# Patient Record
Sex: Male | Born: 1945 | Race: White | Hispanic: No | Marital: Married | Smoking: Former smoker
Health system: Southern US, Community
[De-identification: ages and names within clinical notes are randomized; demographics above are authoritative.]

## PROBLEM LIST (undated history)

## (undated) DIAGNOSIS — E119 Type 2 diabetes mellitus without complications: Secondary | ICD-10-CM

## (undated) DIAGNOSIS — Z8619 Personal history of other infectious and parasitic diseases: Secondary | ICD-10-CM

## (undated) DIAGNOSIS — I10 Essential (primary) hypertension: Secondary | ICD-10-CM

## (undated) DIAGNOSIS — E669 Obesity, unspecified: Secondary | ICD-10-CM

## (undated) DIAGNOSIS — E785 Hyperlipidemia, unspecified: Secondary | ICD-10-CM

## (undated) DIAGNOSIS — C801 Malignant (primary) neoplasm, unspecified: Secondary | ICD-10-CM

## (undated) DIAGNOSIS — R29898 Other symptoms and signs involving the musculoskeletal system: Secondary | ICD-10-CM

## (undated) HISTORY — DX: Personal history of other infectious and parasitic diseases: Z86.19

## (undated) HISTORY — DX: Malignant (primary) neoplasm, unspecified: C80.1

## (undated) HISTORY — DX: Type 2 diabetes mellitus without complications: E11.9

## (undated) HISTORY — DX: Hyperlipidemia, unspecified: E78.5

## (undated) HISTORY — DX: Essential (primary) hypertension: I10

## (undated) HISTORY — DX: Obesity, unspecified: E66.9

## (undated) HISTORY — DX: Other symptoms and signs involving the musculoskeletal system: R29.898

---

## 2002-04-01 DIAGNOSIS — Z86010 Personal history of colonic polyps: Secondary | ICD-10-CM | POA: Insufficient documentation

## 2002-04-01 LAB — HM COLONOSCOPY

## 2002-10-02 ENCOUNTER — Encounter: Payer: Self-pay | Admitting: Neurosurgery

## 2002-10-02 ENCOUNTER — Encounter: Payer: Self-pay | Admitting: Diagnostic Radiology

## 2002-10-02 ENCOUNTER — Encounter: Admission: RE | Admit: 2002-10-02 | Discharge: 2002-10-02 | Payer: Self-pay | Admitting: Neurosurgery

## 2002-10-16 ENCOUNTER — Encounter: Admission: RE | Admit: 2002-10-16 | Discharge: 2002-10-16 | Payer: Self-pay | Admitting: Neurosurgery

## 2002-10-16 ENCOUNTER — Encounter: Payer: Self-pay | Admitting: Neurosurgery

## 2004-03-06 ENCOUNTER — Emergency Department: Payer: Self-pay | Admitting: Emergency Medicine

## 2004-08-06 HISTORY — PX: OTHER SURGICAL HISTORY: SHX169

## 2004-12-28 ENCOUNTER — Emergency Department: Payer: Self-pay | Admitting: Emergency Medicine

## 2005-02-06 ENCOUNTER — Ambulatory Visit: Payer: Self-pay | Admitting: General Surgery

## 2005-06-24 ENCOUNTER — Inpatient Hospital Stay: Payer: Self-pay

## 2005-06-24 ENCOUNTER — Other Ambulatory Visit: Payer: Self-pay

## 2010-07-06 ENCOUNTER — Ambulatory Visit: Payer: Self-pay | Admitting: Gastroenterology

## 2010-07-06 LAB — HM COLONOSCOPY

## 2010-07-07 LAB — PATHOLOGY REPORT

## 2010-07-11 ENCOUNTER — Ambulatory Visit: Payer: Self-pay | Admitting: Gastroenterology

## 2010-07-11 DIAGNOSIS — K760 Fatty (change of) liver, not elsewhere classified: Secondary | ICD-10-CM | POA: Insufficient documentation

## 2010-07-11 HISTORY — PX: OTHER SURGICAL HISTORY: SHX169

## 2011-05-21 ENCOUNTER — Ambulatory Visit: Payer: Self-pay | Admitting: Family Medicine

## 2011-06-04 ENCOUNTER — Ambulatory Visit: Payer: Self-pay | Admitting: Family Medicine

## 2011-07-04 ENCOUNTER — Ambulatory Visit: Payer: Self-pay | Admitting: Family Medicine

## 2011-07-12 ENCOUNTER — Emergency Department: Payer: Self-pay | Admitting: Emergency Medicine

## 2011-07-16 ENCOUNTER — Ambulatory Visit: Payer: Self-pay | Admitting: Family Medicine

## 2011-08-16 ENCOUNTER — Ambulatory Visit: Payer: Self-pay | Admitting: Pain Medicine

## 2011-09-18 ENCOUNTER — Encounter: Payer: Self-pay | Admitting: Specialist

## 2011-10-04 ENCOUNTER — Encounter: Payer: Self-pay | Admitting: Specialist

## 2012-08-06 DIAGNOSIS — E669 Obesity, unspecified: Secondary | ICD-10-CM | POA: Diagnosis not present

## 2012-08-06 DIAGNOSIS — Z794 Long term (current) use of insulin: Secondary | ICD-10-CM | POA: Diagnosis not present

## 2012-11-10 DIAGNOSIS — Z794 Long term (current) use of insulin: Secondary | ICD-10-CM | POA: Diagnosis not present

## 2013-03-09 DIAGNOSIS — IMO0001 Reserved for inherently not codable concepts without codable children: Secondary | ICD-10-CM | POA: Diagnosis not present

## 2013-03-16 DIAGNOSIS — I10 Essential (primary) hypertension: Secondary | ICD-10-CM | POA: Diagnosis not present

## 2013-03-16 DIAGNOSIS — IMO0001 Reserved for inherently not codable concepts without codable children: Secondary | ICD-10-CM | POA: Diagnosis not present

## 2013-03-16 DIAGNOSIS — Z794 Long term (current) use of insulin: Secondary | ICD-10-CM | POA: Diagnosis not present

## 2013-03-30 DIAGNOSIS — E1149 Type 2 diabetes mellitus with other diabetic neurological complication: Secondary | ICD-10-CM | POA: Diagnosis not present

## 2013-03-30 DIAGNOSIS — M79609 Pain in unspecified limb: Secondary | ICD-10-CM | POA: Diagnosis not present

## 2013-03-30 DIAGNOSIS — E1142 Type 2 diabetes mellitus with diabetic polyneuropathy: Secondary | ICD-10-CM | POA: Diagnosis not present

## 2013-03-30 DIAGNOSIS — B351 Tinea unguium: Secondary | ICD-10-CM | POA: Diagnosis not present

## 2013-06-08 DIAGNOSIS — H2510 Age-related nuclear cataract, unspecified eye: Secondary | ICD-10-CM | POA: Diagnosis not present

## 2013-06-17 DIAGNOSIS — IMO0001 Reserved for inherently not codable concepts without codable children: Secondary | ICD-10-CM | POA: Diagnosis not present

## 2013-06-24 DIAGNOSIS — Z794 Long term (current) use of insulin: Secondary | ICD-10-CM | POA: Diagnosis not present

## 2013-06-24 DIAGNOSIS — E119 Type 2 diabetes mellitus without complications: Secondary | ICD-10-CM | POA: Diagnosis not present

## 2013-06-24 DIAGNOSIS — E875 Hyperkalemia: Secondary | ICD-10-CM | POA: Diagnosis not present

## 2013-09-22 DIAGNOSIS — E119 Type 2 diabetes mellitus without complications: Secondary | ICD-10-CM | POA: Diagnosis not present

## 2013-09-29 DIAGNOSIS — IMO0001 Reserved for inherently not codable concepts without codable children: Secondary | ICD-10-CM | POA: Diagnosis not present

## 2013-10-01 DIAGNOSIS — IMO0001 Reserved for inherently not codable concepts without codable children: Secondary | ICD-10-CM | POA: Diagnosis not present

## 2013-10-05 DIAGNOSIS — E1149 Type 2 diabetes mellitus with other diabetic neurological complication: Secondary | ICD-10-CM | POA: Diagnosis not present

## 2013-10-05 DIAGNOSIS — B351 Tinea unguium: Secondary | ICD-10-CM | POA: Diagnosis not present

## 2013-10-05 DIAGNOSIS — E1142 Type 2 diabetes mellitus with diabetic polyneuropathy: Secondary | ICD-10-CM | POA: Diagnosis not present

## 2013-11-18 DIAGNOSIS — E119 Type 2 diabetes mellitus without complications: Secondary | ICD-10-CM | POA: Diagnosis not present

## 2013-11-18 DIAGNOSIS — I10 Essential (primary) hypertension: Secondary | ICD-10-CM | POA: Diagnosis not present

## 2013-11-18 DIAGNOSIS — Z23 Encounter for immunization: Secondary | ICD-10-CM | POA: Diagnosis not present

## 2013-11-18 DIAGNOSIS — L219 Seborrheic dermatitis, unspecified: Secondary | ICD-10-CM | POA: Diagnosis not present

## 2013-11-30 DIAGNOSIS — R9431 Abnormal electrocardiogram [ECG] [EKG]: Secondary | ICD-10-CM | POA: Diagnosis not present

## 2013-11-30 DIAGNOSIS — Z125 Encounter for screening for malignant neoplasm of prostate: Secondary | ICD-10-CM | POA: Diagnosis not present

## 2013-11-30 DIAGNOSIS — E785 Hyperlipidemia, unspecified: Secondary | ICD-10-CM | POA: Diagnosis not present

## 2013-11-30 DIAGNOSIS — I10 Essential (primary) hypertension: Secondary | ICD-10-CM | POA: Diagnosis not present

## 2013-11-30 DIAGNOSIS — R609 Edema, unspecified: Secondary | ICD-10-CM | POA: Diagnosis not present

## 2013-11-30 LAB — PSA: PSA: 0.1

## 2013-11-30 LAB — LIPID PANEL
CHOLESTEROL: 147 mg/dL (ref 0–200)
HDL: 25 mg/dL — AB (ref 35–70)
LDL Cholesterol: 50 mg/dL
TRIGLYCERIDES: 359 mg/dL — AB (ref 40–160)

## 2013-12-14 DIAGNOSIS — I10 Essential (primary) hypertension: Secondary | ICD-10-CM | POA: Diagnosis not present

## 2013-12-14 DIAGNOSIS — R0602 Shortness of breath: Secondary | ICD-10-CM | POA: Diagnosis not present

## 2013-12-14 DIAGNOSIS — R9431 Abnormal electrocardiogram [ECG] [EKG]: Secondary | ICD-10-CM | POA: Diagnosis not present

## 2013-12-14 DIAGNOSIS — I119 Hypertensive heart disease without heart failure: Secondary | ICD-10-CM | POA: Diagnosis not present

## 2014-01-01 DIAGNOSIS — E1165 Type 2 diabetes mellitus with hyperglycemia: Secondary | ICD-10-CM | POA: Diagnosis not present

## 2014-01-01 DIAGNOSIS — Z794 Long term (current) use of insulin: Secondary | ICD-10-CM | POA: Diagnosis not present

## 2014-01-11 DIAGNOSIS — I119 Hypertensive heart disease without heart failure: Secondary | ICD-10-CM | POA: Diagnosis not present

## 2014-01-11 DIAGNOSIS — I10 Essential (primary) hypertension: Secondary | ICD-10-CM | POA: Diagnosis not present

## 2014-01-11 DIAGNOSIS — E782 Mixed hyperlipidemia: Secondary | ICD-10-CM | POA: Diagnosis not present

## 2014-01-11 DIAGNOSIS — I5032 Chronic diastolic (congestive) heart failure: Secondary | ICD-10-CM | POA: Diagnosis not present

## 2014-02-12 DIAGNOSIS — L739 Follicular disorder, unspecified: Secondary | ICD-10-CM | POA: Diagnosis not present

## 2014-03-31 DIAGNOSIS — E1165 Type 2 diabetes mellitus with hyperglycemia: Secondary | ICD-10-CM | POA: Diagnosis not present

## 2014-04-05 DIAGNOSIS — E1142 Type 2 diabetes mellitus with diabetic polyneuropathy: Secondary | ICD-10-CM | POA: Diagnosis not present

## 2014-04-05 DIAGNOSIS — B351 Tinea unguium: Secondary | ICD-10-CM | POA: Diagnosis not present

## 2014-05-18 DIAGNOSIS — I10 Essential (primary) hypertension: Secondary | ICD-10-CM | POA: Diagnosis not present

## 2014-05-18 DIAGNOSIS — E119 Type 2 diabetes mellitus without complications: Secondary | ICD-10-CM | POA: Diagnosis not present

## 2014-06-08 DIAGNOSIS — E11329 Type 2 diabetes mellitus with mild nonproliferative diabetic retinopathy without macular edema: Secondary | ICD-10-CM | POA: Diagnosis not present

## 2014-06-08 DIAGNOSIS — H2513 Age-related nuclear cataract, bilateral: Secondary | ICD-10-CM | POA: Diagnosis not present

## 2014-06-30 DIAGNOSIS — Z794 Long term (current) use of insulin: Secondary | ICD-10-CM | POA: Diagnosis not present

## 2014-06-30 DIAGNOSIS — E119 Type 2 diabetes mellitus without complications: Secondary | ICD-10-CM | POA: Diagnosis not present

## 2014-06-30 DIAGNOSIS — I10 Essential (primary) hypertension: Secondary | ICD-10-CM | POA: Diagnosis not present

## 2014-07-09 DIAGNOSIS — B029 Zoster without complications: Secondary | ICD-10-CM | POA: Diagnosis not present

## 2014-10-04 DIAGNOSIS — B351 Tinea unguium: Secondary | ICD-10-CM | POA: Diagnosis not present

## 2014-10-04 DIAGNOSIS — E1142 Type 2 diabetes mellitus with diabetic polyneuropathy: Secondary | ICD-10-CM | POA: Diagnosis not present

## 2014-10-26 DIAGNOSIS — E119 Type 2 diabetes mellitus without complications: Secondary | ICD-10-CM | POA: Diagnosis not present

## 2014-10-28 ENCOUNTER — Other Ambulatory Visit: Payer: Self-pay | Admitting: Family Medicine

## 2014-11-02 DIAGNOSIS — I10 Essential (primary) hypertension: Secondary | ICD-10-CM | POA: Diagnosis not present

## 2014-11-02 DIAGNOSIS — Z794 Long term (current) use of insulin: Secondary | ICD-10-CM | POA: Diagnosis not present

## 2014-11-02 DIAGNOSIS — E1129 Type 2 diabetes mellitus with other diabetic kidney complication: Secondary | ICD-10-CM | POA: Diagnosis not present

## 2014-11-02 DIAGNOSIS — R809 Proteinuria, unspecified: Secondary | ICD-10-CM | POA: Diagnosis not present

## 2014-11-15 DIAGNOSIS — E118 Type 2 diabetes mellitus with unspecified complications: Secondary | ICD-10-CM | POA: Insufficient documentation

## 2014-11-15 DIAGNOSIS — H9190 Unspecified hearing loss, unspecified ear: Secondary | ICD-10-CM | POA: Insufficient documentation

## 2014-11-15 DIAGNOSIS — D128 Benign neoplasm of rectum: Secondary | ICD-10-CM | POA: Insufficient documentation

## 2014-11-15 DIAGNOSIS — N529 Male erectile dysfunction, unspecified: Secondary | ICD-10-CM | POA: Insufficient documentation

## 2014-11-15 DIAGNOSIS — L219 Seborrheic dermatitis, unspecified: Secondary | ICD-10-CM | POA: Insufficient documentation

## 2014-11-15 DIAGNOSIS — I5030 Unspecified diastolic (congestive) heart failure: Secondary | ICD-10-CM | POA: Insufficient documentation

## 2014-11-15 DIAGNOSIS — E119 Type 2 diabetes mellitus without complications: Secondary | ICD-10-CM | POA: Insufficient documentation

## 2014-11-15 DIAGNOSIS — R609 Edema, unspecified: Secondary | ICD-10-CM | POA: Insufficient documentation

## 2014-11-15 DIAGNOSIS — I5032 Chronic diastolic (congestive) heart failure: Secondary | ICD-10-CM | POA: Insufficient documentation

## 2014-11-15 DIAGNOSIS — M48061 Spinal stenosis, lumbar region without neurogenic claudication: Secondary | ICD-10-CM | POA: Insufficient documentation

## 2014-11-15 DIAGNOSIS — R9431 Abnormal electrocardiogram [ECG] [EKG]: Secondary | ICD-10-CM | POA: Insufficient documentation

## 2014-11-15 DIAGNOSIS — I10 Essential (primary) hypertension: Secondary | ICD-10-CM | POA: Insufficient documentation

## 2014-11-15 DIAGNOSIS — N200 Calculus of kidney: Secondary | ICD-10-CM | POA: Insufficient documentation

## 2014-11-15 DIAGNOSIS — Z87828 Personal history of other (healed) physical injury and trauma: Secondary | ICD-10-CM | POA: Insufficient documentation

## 2014-11-15 DIAGNOSIS — E785 Hyperlipidemia, unspecified: Secondary | ICD-10-CM | POA: Insufficient documentation

## 2014-11-16 ENCOUNTER — Encounter: Payer: Self-pay | Admitting: Family Medicine

## 2014-11-16 ENCOUNTER — Ambulatory Visit (INDEPENDENT_AMBULATORY_CARE_PROVIDER_SITE_OTHER): Payer: Medicare Other | Admitting: Family Medicine

## 2014-11-16 VITALS — BP 128/68 | HR 76 | Temp 98.3°F | Resp 16 | Ht 76.0 in | Wt 362.0 lb

## 2014-11-16 DIAGNOSIS — E119 Type 2 diabetes mellitus without complications: Secondary | ICD-10-CM | POA: Diagnosis not present

## 2014-11-16 DIAGNOSIS — L219 Seborrheic dermatitis, unspecified: Secondary | ICD-10-CM | POA: Diagnosis not present

## 2014-11-16 DIAGNOSIS — Z23 Encounter for immunization: Secondary | ICD-10-CM | POA: Diagnosis not present

## 2014-11-16 DIAGNOSIS — E785 Hyperlipidemia, unspecified: Secondary | ICD-10-CM

## 2014-11-16 DIAGNOSIS — I10 Essential (primary) hypertension: Secondary | ICD-10-CM | POA: Diagnosis not present

## 2014-11-16 MED ORDER — KETOCONAZOLE 2 % EX CREA: 1.0000 "application " | TOPICAL_CREAM | Freq: Every day | CUTANEOUS | Status: DC

## 2014-11-16 NOTE — Progress Notes (Signed)
Patient: Scott Simpson Male    DOB: 11-19-1945   69 y.o.   MRN: [id number] Visit Date: 11/16/2014  Today's Provider: Lelon Huh, MD   Chief Complaint  Patient presents with  . Hypertension    follow up  . Hyperlipidemia    follow up  . Congestive Heart Failure    follow up   Subjective:    HPI      Hypertension, follow-up:  BP Readings from Last 3 Encounters:  11/16/14 128/68  05/18/14 120/70    He was last seen for hypertension 6 months ago.  BP at that visit was  120/70. Management since that visit includes  none. He reports good compliance with treatment. He is not having side effects.  He is not exercising. He is not adherent to low salt diet.   Outside blood pressures are not being checked. He is experiencing none.  Patient denies chest pain, chest pressure/discomfort, claudication, dyspnea, exertional chest pressure/discomfort, fatigue, irregular heart beat, lower extremity edema, near-syncope, orthopnea, palpitations, paroxysmal nocturnal dyspnea, syncope and tachypnea.   Cardiovascular risk factors include diabetes mellitus, dyslipidemia, hypertension, male gender and sedentary lifestyle.  Use of agents associated with hypertension: none.     Weight trend: stable Wt Readings from Last 3 Encounters:  05/18/14 358 lb (162.388 kg)    Current diet: in general, an "unhealthy" diet    Lipid/Cholesterol, Follow-up:   Last seen for this6 months ago.  Management changes since that visit include  none. . Last Lipid Panel:    Component Value Date/Time   CHOL 147 11/30/2013   TRIG 359* 11/30/2013   HDL 25* 11/30/2013   LDLCALC 50 11/30/2013    Risk factors for vascular disease include diabetes mellitus, hypercholesterolemia and hypertension  He reports good compliance with treatment. He is not having side effects.  Current symptoms include none and have been stable. Weight trend: stable Prior visit with dietician: no Current diet: in  general, an "unhealthy" diet Current exercise: none  Wt Readings from Last 3 Encounters:  05/18/14 358 lb (162.388 kg)    -------------------------------------------------------------------   Follow up Diastolic Dysfunction with heart failure: Last office visit was 6 months ago and no changes were made. Patient Cardiologist is Dr. Nehemiah Massed and the last follow up visit with him was 01/11/2014. Patient reports he does not have an upcoming appointment scheduled.   ------------------------------------------------------------------------  Seborrhea Previously prescribed triamcinolone, which worked on rash on side of neck, but has not been very effective for areas on forehead.    No Known Allergies Previous Medications   AMLODIPINE (NORVASC) 5 MG TABLET    Take 1 tablet by mouth daily.   CANAGLIFLOZIN (INVOKANA) 300 MG TABS TABLET    Take 1 tablet by mouth daily.   FOLIC ACID (FOLVITE) 1 MG TABLET    Take 1 tablet by mouth daily.   FUROSEMIDE (LASIX) 20 MG TABLET    Take 1 tablet by mouth daily as needed. For swelling   HYDRALAZINE (APRESOLINE) 100 MG TABLET    Take 100 mg by mouth 2 (two) times daily.   INSULIN REGULAR HUMAN CONCENTRATED (HUMULIN R) 500 UNIT/ML INJECTION    Inject 27 Units into the skin every morning. And lunch time. Inject 29 units at night   LISINOPRIL (PRINIVIL,ZESTRIL) 20 MG TABLET    Take 1 tablet by mouth daily.   LOVASTATIN (MEVACOR) 40 MG TABLET    take 1 tablet by mouth once daily   METFORMIN (GLUCOPHAGE) 1000 MG  TABLET    Take 1,000 mg by mouth 2 (two) times daily with a meal.   METOPROLOL SUCCINATE (TOPROL-XL) 100 MG 24 HR TABLET    Take 1 tablet by mouth daily.   MUPIROCIN OINTMENT (BACTROBAN) 2 %    Apply 1 application topically 3 (three) times daily.    Review of Systems  Constitutional: Negative for fever, chills and appetite change.  Respiratory: Negative for chest tightness, shortness of breath and wheezing.   Cardiovascular: Negative for chest pain  and palpitations.  Gastrointestinal: Negative for nausea, vomiting and abdominal pain.    Social History  Substance Use Topics  . Smoking status: Former Smoker -- 2.00 packs/day for 35 years    Types: Cigarettes    Quit date: 03/05/2004  . Smokeless tobacco: Not on file  . Alcohol Use: No   Objective:   BP 128/68 mmHg  Pulse 76  Temp(Src) 98.3 F (36.8 C) (Oral)  Resp 16  Ht '6\' 4"'$  (1.93 m)  Wt 362 lb (164.202 kg)  BMI 44.08 kg/m2  SpO2 93%  Physical Exam General Appearance:    Alert, cooperative, no distress, obese  Eyes:    PERRL, conjunctiva/corneas clear, EOM's intact       Lungs:     Clear to auscultation bilaterally, respirations unlabored  Heart:    Regular rate and rhythm  Neurologic:   Awake, alert, oriented x 3. No apparent focal neurological           defect.            Assessment & Plan:     1. Diabetes mellitus without complication well controlled The current medical regimen is effective;  continue present plan and medications.   2. HLD (hyperlipidemia) He is tolerating lovastatin well with no adverse effects.   - Lipid panel  3. Essential hypertension well controlled Continue current medications.    4. Seborrhea Forehead. Tried triamcinolone which was previously written for rash on neck, but was not effective.  - ketoconazole (NIZORAL) 2 % cream; Apply 1 application topically daily.  Dispense: 30 g; Refill: 3  5. Need for pneumococcal vaccination - Pneumovax-23  6. Need for influenza vaccination  - Flu vaccine HIGH DOSE PF       Lelon Huh, MD  [facility]

## 2014-11-17 LAB — LIPID PANEL
CHOLESTEROL TOTAL: 133 mg/dL (ref 100–199)
Chol/HDL Ratio: 4.9 ratio units (ref 0.0–5.0)
HDL: 27 mg/dL — ABNORMAL LOW (ref >39)
LDL CALC: 46 mg/dL (ref 0–99)
Triglycerides: 299 mg/dL — ABNORMAL HIGH (ref 0–149)
VLDL Cholesterol Cal: 60 mg/dL — ABNORMAL HIGH (ref 5–40)

## 2014-12-21 ENCOUNTER — Other Ambulatory Visit: Payer: Self-pay | Admitting: *Deleted

## 2014-12-21 MED ORDER — METOPROLOL SUCCINATE ER 100 MG PO TB24: 100.0000 mg | ORAL_TABLET | Freq: Every day | ORAL | Status: DC

## 2015-01-22 ENCOUNTER — Other Ambulatory Visit: Payer: Self-pay | Admitting: Family Medicine

## 2015-02-21 ENCOUNTER — Other Ambulatory Visit: Payer: Self-pay | Admitting: Family Medicine

## 2015-02-23 ENCOUNTER — Other Ambulatory Visit: Payer: Self-pay | Admitting: Family Medicine

## 2015-04-04 DIAGNOSIS — Z794 Long term (current) use of insulin: Secondary | ICD-10-CM | POA: Diagnosis not present

## 2015-04-04 DIAGNOSIS — E1142 Type 2 diabetes mellitus with diabetic polyneuropathy: Secondary | ICD-10-CM | POA: Diagnosis not present

## 2015-04-04 DIAGNOSIS — B351 Tinea unguium: Secondary | ICD-10-CM | POA: Diagnosis not present

## 2015-04-26 ENCOUNTER — Other Ambulatory Visit: Payer: Self-pay | Admitting: *Deleted

## 2015-04-26 MED ORDER — AMLODIPINE BESYLATE 5 MG PO TABS: 5.0000 mg | ORAL_TABLET | Freq: Every day | ORAL | Status: DC

## 2015-04-27 DIAGNOSIS — E1129 Type 2 diabetes mellitus with other diabetic kidney complication: Secondary | ICD-10-CM | POA: Diagnosis not present

## 2015-04-27 DIAGNOSIS — R809 Proteinuria, unspecified: Secondary | ICD-10-CM | POA: Diagnosis not present

## 2015-04-27 DIAGNOSIS — Z794 Long term (current) use of insulin: Secondary | ICD-10-CM | POA: Diagnosis not present

## 2015-04-27 LAB — HEMOGLOBIN A1C: Hemoglobin A1C: 6.5

## 2015-05-04 DIAGNOSIS — R809 Proteinuria, unspecified: Secondary | ICD-10-CM | POA: Diagnosis not present

## 2015-05-04 DIAGNOSIS — I10 Essential (primary) hypertension: Secondary | ICD-10-CM | POA: Diagnosis not present

## 2015-05-04 DIAGNOSIS — Z794 Long term (current) use of insulin: Secondary | ICD-10-CM | POA: Diagnosis not present

## 2015-05-04 DIAGNOSIS — E1129 Type 2 diabetes mellitus with other diabetic kidney complication: Secondary | ICD-10-CM | POA: Diagnosis not present

## 2015-05-17 ENCOUNTER — Ambulatory Visit (INDEPENDENT_AMBULATORY_CARE_PROVIDER_SITE_OTHER): Payer: Medicare Other | Admitting: Family Medicine

## 2015-05-17 ENCOUNTER — Encounter: Payer: Self-pay | Admitting: Family Medicine

## 2015-05-17 VITALS — BP 130/64 | HR 82 | Temp 98.1°F | Resp 20 | Ht 76.0 in | Wt 360.0 lb

## 2015-05-17 DIAGNOSIS — I10 Essential (primary) hypertension: Secondary | ICD-10-CM

## 2015-05-17 DIAGNOSIS — E119 Type 2 diabetes mellitus without complications: Secondary | ICD-10-CM | POA: Diagnosis not present

## 2015-05-17 DIAGNOSIS — E669 Obesity, unspecified: Secondary | ICD-10-CM | POA: Diagnosis not present

## 2015-05-17 NOTE — Patient Instructions (Signed)
   Recommend taking 81mg enteric coated aspirin to reduce risk of vascular events such as heart attacks and strokes.    

## 2015-05-17 NOTE — Progress Notes (Signed)
Patient: Scott Simpson Male    DOB: 11/21/1945   70 y.o.   MRN: [id number] Visit Date: 05/17/2015  Today's Provider: Lelon Huh, MD   Chief Complaint  Patient presents with  . Follow-up  . Diabetes    followed by Endocrinology  . Hypertension  . Hyperlipidemia   Subjective:    HPI  ------------------------------------------------------------------------   Hypertension, follow-up:  BP Readings from Last 3 Encounters:  05/17/15 142/64  11/16/14 128/68  05/18/14 120/70    He was last seen for hypertension 6 months ago.  BP at that visit was  128/68. Management since that visit includes no changes.He reports good compliance with treatment. He is not having side effects.  He is not exercising. He is not adherent to low salt diet.   Outside blood pressures are not being checked. He is experiencing none.  Patient denies chest pain, chest pressure/discomfort, claudication, dyspnea, exertional chest pressure/discomfort, fatigue, irregular heart beat, lower extremity edema, near-syncope, orthopnea, palpitations, paroxysmal nocturnal dyspnea, syncope and tachypnea.   Cardiovascular risk factors include advanced age (older than 4 for men, 7 for women), diabetes mellitus, dyslipidemia, hypertension, male gender and sedentary lifestyle.  Use of agents associated with hypertension: none.   ------------------------------------------------------------------------    Lipid/Cholesterol, Follow-up:   Last seen for this 6 months ago.  Management since that visit includes no changes.  Last Lipid Panel:    Component Value Date/Time   CHOL 133 11/16/2014 1549   CHOL 147 11/30/2013   TRIG 299* 11/16/2014 1549   HDL 27* 11/16/2014 1549   HDL 25* 11/30/2013   CHOLHDL 4.9 11/16/2014 1549   LDLCALC 46 11/16/2014 1549   LDLCALC 50 11/30/2013    He reports good compliance with treatment. He is not having side effects.   Wt Readings from Last 3 Encounters:  05/17/15  360 lb (163.295 kg)  11/16/14 362 lb (164.202 kg)  05/18/14 358 lb (162.388 kg)    ------------------------------------------------------------------------ Diabetes follow up  Continue regular follow up with Dr. Gabriel Carina with last A1c in February=6.4%      No Known Allergies Previous Medications   AMLODIPINE (NORVASC) 5 MG TABLET    Take 1 tablet (5 mg total) by mouth daily.   CANAGLIFLOZIN (INVOKANA) 300 MG TABS TABLET    Take 1 tablet by mouth daily.   FOLIC ACID (FOLVITE) 1 MG TABLET    take 1 tablet by mouth once daily   FUROSEMIDE (LASIX) 20 MG TABLET    take 1 tablet by mouth once daily if needed edema   HYDRALAZINE (APRESOLINE) 100 MG TABLET    Take 100 mg by mouth 2 (two) times daily.   INSULIN REGULAR HUMAN CONCENTRATED (HUMULIN R) 500 UNIT/ML INJECTION    Inject 25 Units into the skin every morning. Inject 27 units at noom. Inject 29 units at night   KETOCONAZOLE (NIZORAL) 2 % CREAM    Apply 1 application topically daily.   LISINOPRIL (PRINIVIL,ZESTRIL) 20 MG TABLET    take 1 tablet by mouth once daily   LOVASTATIN (MEVACOR) 40 MG TABLET    take 1 tablet by mouth once daily   METFORMIN (GLUCOPHAGE) 1000 MG TABLET    Take 1,000 mg by mouth 2 (two) times daily with a meal.   METOPROLOL SUCCINATE (TOPROL-XL) 100 MG 24 HR TABLET    Take 1 tablet (100 mg total) by mouth daily.   MUPIROCIN OINTMENT (BACTROBAN) 2 %    Apply 1 application topically 3 (three) times daily.  Review of Systems  Constitutional: Negative for fever, chills and appetite change.  Respiratory: Negative for chest tightness, shortness of breath and wheezing.   Cardiovascular: Negative for chest pain and palpitations.  Gastrointestinal: Negative for nausea, vomiting and abdominal pain.    Social History  Substance Use Topics  . Smoking status: Former Smoker -- 2.00 packs/day for 35 years    Types: Cigarettes    Quit date: 03/05/2004  . Smokeless tobacco: Not on file  . Alcohol Use: No   Objective:    BP 142/64 mmHg  Pulse 82  Temp(Src) 98.1 F (36.7 C) (Oral)  Resp 20  Ht '6\' 4"'$  (1.93 m)  Wt 360 lb (163.295 kg)  BMI 43.84 kg/m2  Physical Exam  General Appearance:    Alert, cooperative, no distress, morbidly obese  Eyes:    PERRL, conjunctiva/corneas clear, EOM's intact       Lungs:     Clear to auscultation bilaterally, respirations unlabored  Heart:    Regular rate and rhythm  Neurologic:   Awake, alert, oriented x 3. No apparent focal neurological           defect.           Assessment & Plan:     1. Obesity Strongly encourage regular exercise.   2. Essential hypertension Well controlled.  Continue current medications.    3. Diabetes mellitus without complication (HCC) Doing well on current regiment managed by Dr. Gabriel Carina.  Lelon Huh, MD  [facility]

## 2015-06-14 DIAGNOSIS — H2513 Age-related nuclear cataract, bilateral: Secondary | ICD-10-CM | POA: Diagnosis not present

## 2015-06-14 LAB — HM DIABETES EYE EXAM

## 2015-09-12 DIAGNOSIS — Z794 Long term (current) use of insulin: Secondary | ICD-10-CM | POA: Diagnosis not present

## 2015-09-12 DIAGNOSIS — E1129 Type 2 diabetes mellitus with other diabetic kidney complication: Secondary | ICD-10-CM | POA: Diagnosis not present

## 2015-09-12 DIAGNOSIS — R809 Proteinuria, unspecified: Secondary | ICD-10-CM | POA: Diagnosis not present

## 2015-09-19 DIAGNOSIS — I10 Essential (primary) hypertension: Secondary | ICD-10-CM | POA: Diagnosis not present

## 2015-09-19 DIAGNOSIS — Z794 Long term (current) use of insulin: Secondary | ICD-10-CM | POA: Diagnosis not present

## 2015-09-19 DIAGNOSIS — R809 Proteinuria, unspecified: Secondary | ICD-10-CM | POA: Diagnosis not present

## 2015-09-19 DIAGNOSIS — E1129 Type 2 diabetes mellitus with other diabetic kidney complication: Secondary | ICD-10-CM | POA: Diagnosis not present

## 2015-10-03 DIAGNOSIS — B351 Tinea unguium: Secondary | ICD-10-CM | POA: Diagnosis not present

## 2015-10-03 DIAGNOSIS — E1142 Type 2 diabetes mellitus with diabetic polyneuropathy: Secondary | ICD-10-CM | POA: Diagnosis not present

## 2015-10-03 DIAGNOSIS — Z794 Long term (current) use of insulin: Secondary | ICD-10-CM | POA: Diagnosis not present

## 2015-10-26 ENCOUNTER — Other Ambulatory Visit: Payer: Self-pay | Admitting: Family Medicine

## 2015-11-01 ENCOUNTER — Other Ambulatory Visit: Payer: Self-pay

## 2015-11-21 ENCOUNTER — Ambulatory Visit (INDEPENDENT_AMBULATORY_CARE_PROVIDER_SITE_OTHER): Payer: Medicare Other | Admitting: Family Medicine

## 2015-11-21 ENCOUNTER — Encounter: Payer: Self-pay | Admitting: Family Medicine

## 2015-11-21 VITALS — BP 118/62 | HR 78 | Temp 99.4°F | Resp 18 | Wt 376.0 lb

## 2015-11-21 DIAGNOSIS — Z23 Encounter for immunization: Secondary | ICD-10-CM | POA: Diagnosis not present

## 2015-11-21 DIAGNOSIS — Z125 Encounter for screening for malignant neoplasm of prostate: Secondary | ICD-10-CM | POA: Diagnosis not present

## 2015-11-21 DIAGNOSIS — I10 Essential (primary) hypertension: Secondary | ICD-10-CM

## 2015-11-21 DIAGNOSIS — E119 Type 2 diabetes mellitus without complications: Secondary | ICD-10-CM

## 2015-11-21 DIAGNOSIS — E785 Hyperlipidemia, unspecified: Secondary | ICD-10-CM | POA: Diagnosis not present

## 2015-11-21 DIAGNOSIS — Z7409 Other reduced mobility: Secondary | ICD-10-CM

## 2015-11-21 DIAGNOSIS — Z86010 Personal history of colonic polyps: Secondary | ICD-10-CM | POA: Diagnosis not present

## 2015-11-21 NOTE — Patient Instructions (Addendum)
   Please call [facility] Department of Gastroenterology [phone] to schedule follow up colonoscopy   Walk outside for at least 10 minutes 2-3 times a day while the weather is nice

## 2015-11-21 NOTE — Progress Notes (Signed)
Patient: Scott Simpson Male    DOB: 1945-06-17   70 y.o.   MRN: [id number] Visit Date: 11/21/2015  Today's Provider: Lelon Huh, MD   Chief Complaint  Patient presents with  . Hypertension    follow up  . Hyperlipidemia    follow up  . Obesity    follow up  . Diabetes    follow up   Subjective:    HPI  Hypertension, follow-up:  BP Readings from Last 3 Encounters:  05/17/15 130/64  11/16/14 128/68  05/18/14 120/70    He was last seen for hypertension 6 months ago.  BP at that visit was 142/64. Management since that visit includes no changes. He reports good compliance with treatment. He is not having side effects.  He is not exercising. He is not adherent to low salt diet.   Outside blood pressures are checked occasionally. He is experiencing none.  Patient denies chest pain, chest pressure/discomfort, claudication, dyspnea, exertional chest pressure/discomfort, fatigue, irregular heart beat, lower extremity edema, near-syncope, orthopnea, palpitations, paroxysmal nocturnal dyspnea, syncope and tachypnea.   Cardiovascular risk factors include advanced age (older than 45 for men, 43 for women), diabetes mellitus, hypertension, male gender, obesity (BMI >= 30 kg/m2) and sedentary lifestyle.  Use of agents associated with hypertension: NSAIDS.     Weight trend: increasing steadily Wt Readings from Last 3 Encounters:  05/17/15 (!) 360 lb (163.3 kg)  11/16/14 (!) 362 lb (164.2 kg)  05/18/14 (!) 358 lb (162.4 kg)    Current diet: in general, an "unhealthy" diet  ------------------------------------------------------------------------  Lipid/Cholesterol, Follow-up:   Last seen for this1 months ago.  Management changes since that visit include no changes. . Last Lipid Panel:    Component Value Date/Time   CHOL 133 11/16/2014 1549   TRIG 299 (H) 11/16/2014 1549   HDL 27 (L) 11/16/2014 1549   CHOLHDL 4.9 11/16/2014 1549   LDLCALC 46 11/16/2014 1549      Risk factors for vascular disease include diabetes mellitus and hypertension  He reports good compliance with treatment. He is not having side effects.  Current symptoms include none and have been stable. Weight trend: increasing steadily Prior visit with dietician: no Current diet: in general, an "unhealthy" diet Current exercise: none  Wt Readings from Last 3 Encounters:  05/17/15 (!) 360 lb (163.3 kg)  11/16/14 (!) 362 lb (164.2 kg)  05/18/14 (!) 358 lb (162.4 kg)    ------------------------------------------------------------------- Follow up Obesity:  Patient was last seen 6 months ago for this problem. Dur that visit, patient was encouraged to start regular exercise. Patient comes in today stating he does not exercise at all.   Diabetes:  Last office visit was 6 months ago. No changes were made. Diabetes is managed by Dr. Gabriel Carina.  Patient was last seen Dr. Dr. Gabriel Carina 09/19/2015 per patient report and no changes were made. Last A1c by Dr. Gabriel Carina was 6.7     No Known Allergies   Current Outpatient Prescriptions:  .  amLODipine (NORVASC) 5 MG tablet, take 1 tablet by mouth once daily, Disp: 30 tablet, Rfl: 6 .  aspirin 81 MG tablet, Take 81 mg by mouth daily., Disp: , Rfl:  .  canagliflozin (INVOKANA) 300 MG TABS tablet, Take 1 tablet by mouth daily., Disp: , Rfl:  .  folic acid (FOLVITE) 1 MG tablet, take 1 tablet by mouth once daily, Disp: 90 tablet, Rfl: 4 .  furosemide (LASIX) 20 MG tablet, take 1 tablet by  mouth once daily if needed edema, Disp: 90 tablet, Rfl: 4 .  hydrALAZINE (APRESOLINE) 100 MG tablet, Take 100 mg by mouth 2 (two) times daily., Disp: , Rfl:  .  insulin regular human CONCENTRATED (HUMULIN R) 500 UNIT/ML injection, Inject 25 Units into the skin every morning. Inject 27 units at noom. Inject 29 units at night, Disp: , Rfl:  .  lisinopril (PRINIVIL,ZESTRIL) 20 MG tablet, take 1 tablet by mouth once daily, Disp: 90 tablet, Rfl: 4 .  lovastatin (MEVACOR)  40 MG tablet, take 1 tablet by mouth once daily, Disp: 90 tablet, Rfl: 3 .  metFORMIN (GLUCOPHAGE) 1000 MG tablet, Take 1,000 mg by mouth 2 (two) times daily with a meal., Disp: , Rfl:  .  metoprolol succinate (TOPROL-XL) 100 MG 24 hr tablet, Take 1 tablet (100 mg total) by mouth daily., Disp: 90 tablet, Rfl: 4  Review of Systems  Constitutional: Negative for appetite change, chills and fever.  Respiratory: Negative for chest tightness, shortness of breath and wheezing.   Cardiovascular: Negative for chest pain and palpitations.  Gastrointestinal: Negative for abdominal pain, nausea and vomiting.    Social History  Substance Use Topics  . Smoking status: Former Smoker    Packs/day: 2.00    Years: 35.00    Types: Cigarettes    Quit date: 03/05/2004  . Smokeless tobacco: Never Used  . Alcohol use No   Objective:   BP 118/62 (BP Location: Right Arm, Patient Position: Sitting, Cuff Size: Large)   Pulse 78   Temp 99.4 F (37.4 C) (Oral)   Resp 18   Wt (!) 376 lb (170.6 kg)   BMI 45.77 kg/m   Physical Exam  General Appearance:    Alert, cooperative, no distress, obese  Eyes:    PERRL, conjunctiva/corneas clear, EOM's intact       Lungs:     Clear to auscultation bilaterally, respirations unlabored  Heart:    Regular rate and rhythm  Neurologic:   Awake, alert, oriented x 3. No apparent focal neurological           defect.           Assessment & Plan:     1. Essential hypertension Well controlled.  Continue current medications.   - EKG 12-Lead  2. Diabetes mellitus without complication ([facility]) Doing well, continue routine follow up Dr. Nilda Simmer - EKG 12-Lead  3. HLD (hyperlipidemia) He is tolerating lovastatin well with no adverse effects.   - Lipid panel - Hepatic function panel  4. Need for influenza vaccination  - Flu vaccine HIGH DOSE PF  5. Prostate cancer screening  - PSA  6. History of colon polyps They recently received letter from GI stating it is time to  set up follow up colonoscopy and patient was encouraged to have this scheduled this year  14. Obesity  Encourage to to take advantage of mild weather and start taking 2-3 short walks every day.        Lelon Huh, MD  [facility]

## 2015-11-22 ENCOUNTER — Telehealth: Payer: Self-pay | Admitting: *Deleted

## 2015-11-22 LAB — LIPID PANEL
CHOL/HDL RATIO: 4.6 ratio (ref 0.0–5.0)
CHOLESTEROL TOTAL: 134 mg/dL (ref 100–199)
HDL: 29 mg/dL — AB (ref >39)
LDL CALC: 50 mg/dL (ref 0–99)
TRIGLYCERIDES: 277 mg/dL — AB (ref 0–149)
VLDL CHOLESTEROL CAL: 55 mg/dL — AB (ref 5–40)

## 2015-11-22 LAB — PSA: PROSTATE SPECIFIC AG, SERUM: 0.1 ng/mL (ref 0.0–4.0)

## 2015-11-22 LAB — HEPATIC FUNCTION PANEL
ALT: 21 IU/L (ref 0–44)
AST: 15 IU/L (ref 0–40)
Albumin: 4.1 g/dL (ref 3.6–4.8)
Alkaline Phosphatase: 59 IU/L (ref 39–117)
Bilirubin Total: 0.2 mg/dL (ref 0.0–1.2)
Bilirubin, Direct: 0.07 mg/dL (ref 0.00–0.40)
TOTAL PROTEIN: 6.9 g/dL (ref 6.0–8.5)

## 2015-11-22 MED ORDER — AMLODIPINE BESYLATE 2.5 MG PO TABS: 2.5000 mg | ORAL_TABLET | Freq: Every day | ORAL | 5 refills | Status: DC

## 2015-11-22 NOTE — Telephone Encounter (Signed)
-----   Message from Birdie Sons, MD sent at 11/22/2015  7:39 AM EDT ----- Labs are good. PSA is normal. Continue current medications.  Recommend reduce dose of amlodipine to 2.'5mg'$  daily, #30, rf x 5 since his BP was low, and follow up in 4 months for BP check. If BP is still doing well at follow up we may be able to get him off of this BP pill.

## 2015-11-22 NOTE — Telephone Encounter (Signed)
Patient's wife Vaughan Basta was notified of results. Expressed understanding. Rx sent to pharmacy.

## 2016-01-13 DIAGNOSIS — R809 Proteinuria, unspecified: Secondary | ICD-10-CM | POA: Diagnosis not present

## 2016-01-13 DIAGNOSIS — Z794 Long term (current) use of insulin: Secondary | ICD-10-CM | POA: Diagnosis not present

## 2016-01-13 DIAGNOSIS — E1129 Type 2 diabetes mellitus with other diabetic kidney complication: Secondary | ICD-10-CM | POA: Diagnosis not present

## 2016-01-16 ENCOUNTER — Other Ambulatory Visit: Payer: Self-pay | Admitting: *Deleted

## 2016-01-16 MED ORDER — METOPROLOL SUCCINATE ER 100 MG PO TB24: 100.0000 mg | ORAL_TABLET | Freq: Every day | ORAL | 4 refills | Status: DC

## 2016-01-20 DIAGNOSIS — E1129 Type 2 diabetes mellitus with other diabetic kidney complication: Secondary | ICD-10-CM | POA: Diagnosis not present

## 2016-01-20 DIAGNOSIS — H60543 Acute eczematoid otitis externa, bilateral: Secondary | ICD-10-CM | POA: Diagnosis not present

## 2016-01-20 DIAGNOSIS — Z794 Long term (current) use of insulin: Secondary | ICD-10-CM | POA: Diagnosis not present

## 2016-01-20 DIAGNOSIS — R809 Proteinuria, unspecified: Secondary | ICD-10-CM | POA: Diagnosis not present

## 2016-01-20 DIAGNOSIS — I10 Essential (primary) hypertension: Secondary | ICD-10-CM | POA: Diagnosis not present

## 2016-02-15 ENCOUNTER — Encounter: Payer: Self-pay | Admitting: Family Medicine

## 2016-02-15 ENCOUNTER — Encounter: Payer: Self-pay | Admitting: *Deleted

## 2016-03-15 ENCOUNTER — Other Ambulatory Visit: Payer: Self-pay | Admitting: Family Medicine

## 2016-03-21 ENCOUNTER — Ambulatory Visit: Payer: Medicare Other | Admitting: Family Medicine

## 2016-03-21 ENCOUNTER — Ambulatory Visit: Payer: Medicare Other

## 2016-04-16 LAB — HEMOGLOBIN A1C: Hemoglobin A1C: 6.3

## 2016-04-17 DIAGNOSIS — E1129 Type 2 diabetes mellitus with other diabetic kidney complication: Secondary | ICD-10-CM | POA: Diagnosis not present

## 2016-04-17 DIAGNOSIS — R809 Proteinuria, unspecified: Secondary | ICD-10-CM | POA: Diagnosis not present

## 2016-04-17 DIAGNOSIS — Z794 Long term (current) use of insulin: Secondary | ICD-10-CM | POA: Diagnosis not present

## 2016-04-17 LAB — MICROALBUMIN, URINE: MICROALB UR: 137

## 2016-04-18 ENCOUNTER — Ambulatory Visit (INDEPENDENT_AMBULATORY_CARE_PROVIDER_SITE_OTHER): Payer: Medicare Other

## 2016-04-18 ENCOUNTER — Encounter: Payer: Self-pay | Admitting: Family Medicine

## 2016-04-18 ENCOUNTER — Ambulatory Visit (INDEPENDENT_AMBULATORY_CARE_PROVIDER_SITE_OTHER): Payer: Medicare Other | Admitting: Family Medicine

## 2016-04-18 VITALS — BP 152/68 | HR 76 | Temp 97.9°F | Ht 76.0 in | Wt 377.8 lb

## 2016-04-18 VITALS — BP 142/66

## 2016-04-18 DIAGNOSIS — Z0000 Encounter for general adult medical examination without abnormal findings: Secondary | ICD-10-CM

## 2016-04-18 DIAGNOSIS — I10 Essential (primary) hypertension: Secondary | ICD-10-CM | POA: Diagnosis not present

## 2016-04-18 DIAGNOSIS — D128 Benign neoplasm of rectum: Secondary | ICD-10-CM

## 2016-04-18 NOTE — Progress Notes (Signed)
Patient: Scott Simpson Male    DOB: 02/03/1946   71 y.o.   MRN: [id number] Visit Date: 04/18/2016  Today's Provider: Lelon Huh, MD   Chief Complaint  Patient presents with  . Hypertension    follow up   . Hyperlipidemia    follow up    Subjective:    HPI  Hypertension, follow-up:  BP Readings from Last 3 Encounters:  04/18/16 (!) 152/68  11/21/15 118/62  05/17/15 130/64    He was last seen for hypertension 5 months ago.  BP at that visit was 118/62. Management since that visit includes decraesing Amlodipine to 2.'5mg'$  daily due to blood pressure running low. He reports good compliance with treatment. He is not having side effects.  He is not exercising. He is adherent to low salt diet.   Outside blood pressures are not being checked. He is experiencing none.  Patient denies chest pain, chest pressure/discomfort, claudication, dyspnea, exertional chest pressure/discomfort, fatigue, irregular heart beat, lower extremity edema, near-syncope, orthopnea, palpitations, paroxysmal nocturnal dyspnea, syncope and tachypnea.   Cardiovascular risk factors include advanced age (older than 69 for men, 22 for women), dyslipidemia, hypertension and male gender.  Use of agents associated with hypertension: NSAIDS.     Weight trend: stable Wt Readings from Last 3 Encounters:  04/18/16 (!) 377 lb 12.8 oz (171.4 kg)  11/21/15 (!) 376 lb (170.6 kg)  05/17/15 (!) 360 lb (163.3 kg)    Current diet: in general, an "unhealthy" diet  ------------------------------------------------------------------------  Lipid/Cholesterol, Follow-up:   Last seen for this1 years ago.  Management changes since that visit include none. . Last Lipid Panel:    Component Value Date/Time   CHOL 134 11/21/2015 1020   TRIG 277 (H) 11/21/2015 1020   HDL 29 (L) 11/21/2015 1020   CHOLHDL 4.6 11/21/2015 1020   LDLCALC 50 11/21/2015 1020    Risk factors for vascular disease include  hypercholesterolemia and hypertension  He reports good compliance with treatment. He is not having side effects.  Current symptoms include none and have been stable. Weight trend: stable Prior visit with dietician: no Current diet: in general, an "unhealthy" diet Current exercise: none  Wt Readings from Last 3 Encounters:  04/18/16 (!) 377 lb 12.8 oz (171.4 kg)  11/21/15 (!) 376 lb (170.6 kg)  05/17/15 (!) 360 lb (163.3 kg)    ------------------------------------------------------------------- Continues regular follow up with Dr. Nilda Simmer for diabetes every four months. Generally feels well with no complaints today.     No Known Allergies   Current Outpatient Prescriptions:  .  amLODipine (NORVASC) 2.5 MG tablet, Take 1 tablet (2.5 mg total) by mouth daily., Disp: 30 tablet, Rfl: 5 .  aspirin 81 MG tablet, Take 81 mg by mouth daily., Disp: , Rfl:  .  canagliflozin (INVOKANA) 300 MG TABS tablet, Take 1 tablet by mouth daily., Disp: , Rfl:  .  folic acid (FOLVITE) 1 MG tablet, take 1 tablet by mouth once daily, Disp: 90 tablet, Rfl: 4 .  furosemide (LASIX) 20 MG tablet, take 1 tablet by mouth once daily IF NEEDED FOR EDEMA, Disp: 90 tablet, Rfl: 4 .  hydrALAZINE (APRESOLINE) 100 MG tablet, Take 100 mg by mouth 2 (two) times daily., Disp: , Rfl:  .  insulin regular human CONCENTRATED (HUMULIN R) 500 UNIT/ML injection, Inject 25 Units into the skin every morning. Inject 27 units at noom. Inject 29 units at night, Disp: , Rfl:  .  lisinopril (PRINIVIL,ZESTRIL) 20 MG tablet,  take 1 tablet by mouth once daily, Disp: 90 tablet, Rfl: 4 .  lovastatin (MEVACOR) 40 MG tablet, take 1 tablet by mouth once daily, Disp: 90 tablet, Rfl: 3 .  metFORMIN (GLUCOPHAGE) 1000 MG tablet, Take 1,000 mg by mouth 2 (two) times daily with a meal., Disp: , Rfl:  .  metoprolol succinate (TOPROL-XL) 100 MG 24 hr tablet, Take 1 tablet (100 mg total) by mouth daily., Disp: 90 tablet, Rfl: 4  Review of Systems    Constitutional: Negative for appetite change, chills, fatigue and fever.  Respiratory: Negative for chest tightness, shortness of breath and wheezing.   Cardiovascular: Negative for chest pain and palpitations.  Gastrointestinal: Negative for abdominal pain, nausea and vomiting.    Social History  Substance Use Topics  . Smoking status: Former Smoker    Packs/day: 2.00    Years: 35.00    Types: Cigarettes    Quit date: 03/05/2004  . Smokeless tobacco: Never Used  . Alcohol use No   Objective:    Vital Signs - Last Recorded  Most recent update: 04/18/2016 3:06 PM by Fabio Neighbors, LPN  BP    086/76 (BP Location: Left Arm)     Pulse  76     Temp  97.9 F (36.6 C) (Oral)     Ht  '6\' 4"'$  (1.93 m)     Wt    377 lb 12.8 oz (171.4 kg)      BMI  45.99 kg/m     Vitals:   04/18/16 1546  BP: (!) 142/66      Physical Exam  General Appearance:    Alert, cooperative, no distress, morbidly obese  Eyes:    PERRL, conjunctiva/corneas clear, EOM's intact       Lungs:     Clear to auscultation bilaterally, respirations unlabored  Heart:    Regular rate and rhythm  Neurologic:   Awake, alert, oriented x 3. No apparent focal neurological           defect.           Assessment & Plan:     1. Essential hypertension BP up today. Advised to increase exercise and avoid salty foods. Will recheck in 4 months and may need another medications if not improved.   2. Adenoma of rectum Last colonoscopy was in May 2014 by Dr. Candace Cruise and was advised to follow up in 3 years.  - Ambulatory referral to Gastroenterology   Return in about 4 months (around 08/16/2016).        Lelon Huh, MD  [facility]

## 2016-04-18 NOTE — Patient Instructions (Signed)
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
## 2016-04-24 DIAGNOSIS — I10 Essential (primary) hypertension: Secondary | ICD-10-CM | POA: Diagnosis not present

## 2016-04-24 DIAGNOSIS — E1129 Type 2 diabetes mellitus with other diabetic kidney complication: Secondary | ICD-10-CM | POA: Diagnosis not present

## 2016-04-24 DIAGNOSIS — Z794 Long term (current) use of insulin: Secondary | ICD-10-CM | POA: Diagnosis not present

## 2016-04-24 DIAGNOSIS — R0683 Snoring: Secondary | ICD-10-CM | POA: Diagnosis not present

## 2016-04-24 DIAGNOSIS — R5382 Chronic fatigue, unspecified: Secondary | ICD-10-CM | POA: Diagnosis not present

## 2016-04-24 DIAGNOSIS — R809 Proteinuria, unspecified: Secondary | ICD-10-CM | POA: Diagnosis not present

## 2016-05-28 ENCOUNTER — Ambulatory Visit (INDEPENDENT_AMBULATORY_CARE_PROVIDER_SITE_OTHER): Payer: Medicare Other | Admitting: Family Medicine

## 2016-05-28 ENCOUNTER — Encounter: Payer: Self-pay | Admitting: Family Medicine

## 2016-05-28 VITALS — BP 150/80 | HR 78 | Temp 99.0°F | Resp 20 | Wt 372.0 lb

## 2016-05-28 DIAGNOSIS — B351 Tinea unguium: Secondary | ICD-10-CM | POA: Diagnosis not present

## 2016-05-28 DIAGNOSIS — I10 Essential (primary) hypertension: Secondary | ICD-10-CM

## 2016-05-28 DIAGNOSIS — E1142 Type 2 diabetes mellitus with diabetic polyneuropathy: Secondary | ICD-10-CM | POA: Diagnosis not present

## 2016-05-28 DIAGNOSIS — Z794 Long term (current) use of insulin: Secondary | ICD-10-CM | POA: Diagnosis not present

## 2016-05-28 NOTE — Progress Notes (Signed)
Patient: Scott Simpson Male    DOB: 1945/08/09   71 y.o.   MRN: [id number] Visit Date: 05/28/2016  Today's Provider: Lelon Huh, MD   Chief Complaint  Patient presents with  . Blood Pressure Check   Subjective:    HPI  Hypertension:  BP Readings from Last 3 Encounters:  04/18/16 (!) 142/66  04/18/16 (!) 152/68  11/21/15 118/62   Patient was seen earlier today by Dr. Vickki Muff and was found to have an elevated blood pressure reading of 208/88. After resting his blood pressure was rechecked and came down to  190/90.  He was last seen for hypertension 1 months ago.  BP at that visit was 152/68. Management since that visit includes counseling patient to increase exercise and avoid salty foods. He reports fair compliance with treatment. Has not been exercising. He is not having side effects.  He is not exercising. He is not adherent to low salt diet.   Outside blood pressures are not usually checked. He is experiencing none.  Patient denies chest pain, chest pressure/discomfort, claudication, dyspnea, exertional chest pressure/discomfort, fatigue, irregular heart beat, lower extremity edema, near-syncope, orthopnea, palpitations, paroxysmal nocturnal dyspnea, syncope and tachypnea.   Cardiovascular risk factors include hypertension, male gender, obesity (BMI >= 30 kg/m2) and sedentary lifestyle.  Use of agents associated with hypertension: NSAIDS.     Weight trend: fluctuating a bit Wt Readings from Last 3 Encounters:  04/18/16 (!) 377 lb 12.8 oz (171.4 kg)  11/21/15 (!) 376 lb (170.6 kg)  05/17/15 (!) 360 lb (163.3 kg)    Current diet: in general, an "unhealthy" diet  ------------------------------------------------------------------------     No Known Allergies   Current Outpatient Prescriptions:  .  amLODipine (NORVASC) 2.5 MG tablet, Take 1 tablet (2.5 mg total) by mouth daily., Disp: 30 tablet, Rfl: 5 .  aspirin 81 MG tablet, Take 81 mg by mouth daily.,  Disp: , Rfl:  .  canagliflozin (INVOKANA) 300 MG TABS tablet, Take 1 tablet by mouth daily., Disp: , Rfl:  .  folic acid (FOLVITE) 1 MG tablet, take 1 tablet by mouth once daily, Disp: 90 tablet, Rfl: 4 .  furosemide (LASIX) 20 MG tablet, take 1 tablet by mouth once daily IF NEEDED FOR EDEMA, Disp: 90 tablet, Rfl: 4 .  hydrALAZINE (APRESOLINE) 100 MG tablet, Take 100 mg by mouth 2 (two) times daily., Disp: , Rfl:  .  insulin regular human CONCENTRATED (HUMULIN R) 500 UNIT/ML injection, Inject 25 Units into the skin every morning. Inject 27 units at noom. Inject 29 units at night, Disp: , Rfl:  .  lisinopril (PRINIVIL,ZESTRIL) 20 MG tablet, take 1 tablet by mouth once daily, Disp: 90 tablet, Rfl: 4 .  lovastatin (MEVACOR) 40 MG tablet, take 1 tablet by mouth once daily, Disp: 90 tablet, Rfl: 3 .  metFORMIN (GLUCOPHAGE) 1000 MG tablet, Take 1,000 mg by mouth 2 (two) times daily with a meal., Disp: , Rfl:  .  metoprolol succinate (TOPROL-XL) 100 MG 24 hr tablet, Take 1 tablet (100 mg total) by mouth daily., Disp: 90 tablet, Rfl: 4  Review of Systems  Constitutional: Negative for appetite change, chills and fever.  Respiratory: Negative for chest tightness, shortness of breath and wheezing.   Cardiovascular: Negative for chest pain and palpitations.  Gastrointestinal: Negative for abdominal pain, nausea and vomiting.  Endocrine: Negative for cold intolerance, polyphagia and polyuria.    Social History  Substance Use Topics  . Smoking status: Former Smoker  Packs/day: 2.00    Years: 35.00    Types: Cigarettes    Quit date: 03/05/2004  . Smokeless tobacco: Never Used  . Alcohol use No   Objective:   BP 128/64   Pulse 78   Temp 99 F (37.2 C) (Oral)   Resp 20   Wt (!) 372 lb (168.7 kg)   SpO2 92% Comment: room air  BMI 45.28 kg/m  Vitals:   05/28/16 1523 05/28/16 1528 05/28/16 1704 05/28/16 1706  BP: 132/64 128/64 130/60 (!) 150/80  Pulse: 78     Resp: 20     Temp: 99 F (37.2  C)     TempSrc: Oral     SpO2: 92%     Weight: (!) 372 lb (168.7 kg)        Physical Exam  General appearance: alert, well developed, well nourished, cooperative and in no distress Head: Normocephalic, without obvious abnormality, atraumatic Respiratory: Respirations even and unlabored, normal respiratory rate Extremities: No gross deformities Skin: Skin color, texture, turgor normal. No rashes seen  Psych: Appropriate mood and affect. Neurologic: Mental status: Alert, oriented to person, place, and time, thought content appropriate.     Assessment & Plan:     1. Essential hypertension High measurements at podiatrist just prior to arriving at this office, but measurements much better here. His wife is going to start checking BP at home daily and call if any significant elevation.        Lelon Huh, MD  [facility]

## 2016-06-08 DIAGNOSIS — G4733 Obstructive sleep apnea (adult) (pediatric): Secondary | ICD-10-CM | POA: Diagnosis not present

## 2016-06-16 ENCOUNTER — Other Ambulatory Visit: Payer: Self-pay | Admitting: Family Medicine

## 2016-06-20 DIAGNOSIS — H2513 Age-related nuclear cataract, bilateral: Secondary | ICD-10-CM | POA: Diagnosis not present

## 2016-06-20 DIAGNOSIS — E119 Type 2 diabetes mellitus without complications: Secondary | ICD-10-CM | POA: Diagnosis not present

## 2016-06-28 DIAGNOSIS — Z86010 Personal history of colonic polyps: Secondary | ICD-10-CM | POA: Diagnosis not present

## 2016-08-15 ENCOUNTER — Encounter: Payer: Self-pay | Admitting: Family Medicine

## 2016-08-15 ENCOUNTER — Ambulatory Visit (INDEPENDENT_AMBULATORY_CARE_PROVIDER_SITE_OTHER): Payer: Medicare Other | Admitting: Family Medicine

## 2016-08-15 VITALS — BP 138/68 | HR 79 | Temp 98.1°F | Resp 16 | Ht 76.0 in | Wt 384.0 lb

## 2016-08-15 DIAGNOSIS — I10 Essential (primary) hypertension: Secondary | ICD-10-CM | POA: Diagnosis not present

## 2016-08-15 DIAGNOSIS — E119 Type 2 diabetes mellitus without complications: Secondary | ICD-10-CM | POA: Diagnosis not present

## 2016-08-15 DIAGNOSIS — E785 Hyperlipidemia, unspecified: Secondary | ICD-10-CM | POA: Diagnosis not present

## 2016-08-15 DIAGNOSIS — Z86010 Personal history of colonic polyps: Secondary | ICD-10-CM

## 2016-08-15 NOTE — Progress Notes (Addendum)
Patient: Scott Simpson Male    DOB: [birth year]   71 y.o.   MRN: [id number] Visit Date: 08/15/2016  Today's Provider: Lelon Huh, MD   Chief Complaint  Patient presents with  . Follow-up  . Hypertension  . Hyperlipidemia   Subjective:    HPI   Hypertension, follow-up:  BP Readings from Last 3 Encounters:  08/15/16 140/64  05/28/16 (!) 150/80  04/18/16 (!) 142/66    He was last seen for hypertension 4 months ago.  BP at that visit was 128/64. Management since that visit includes; advised to check bp at home daily. Marland KitchenHe reports good compliance with treatment. He is not having side effects. none He is not exercising. He is adherent to low salt diet.   Outside blood pressures are normal. He is experiencing none.  Patient denies none.   Cardiovascular risk factors include diabetes mellitus.  Use of agents associated with hypertension: none.   ----------------------------------------------------------------    Lipid/Cholesterol, Follow-up:   Last seen for this 1 years ago.  Management since that visit includes; no changes.  Last Lipid Panel:    Component Value Date/Time   CHOL 134 11/21/2015 1020   TRIG 277 (H) 11/21/2015 1020   HDL 29 (L) 11/21/2015 1020   CHOLHDL 4.6 11/21/2015 1020   LDLCALC 50 11/21/2015 1020    He reports good compliance with treatment. He is not having side effects. none  Wt Readings from Last 3 Encounters:  08/15/16 (!) 384 lb (174.2 kg)  05/28/16 (!) 372 lb (168.7 kg)  04/18/16 (!) 377 lb 12.8 oz (171.4 kg)    ----------------------------------------------------------------  Continues to follow up regularly with Dr. Gabriel Carina for diabetes which has been well controlled.  Lab Results  Component Value Date   HGBA1C 6.3 04/16/2016      No Known Allergies   Current Outpatient Prescriptions:  .  amLODipine (NORVASC) 5 MG tablet, take 1 tablet by mouth once daily, Disp: 30 tablet, Rfl: 12 .  aspirin 81 MG tablet, Take  81 mg by mouth daily., Disp: , Rfl:  .  canagliflozin (INVOKANA) 300 MG TABS tablet, Take 1 tablet by mouth daily., Disp: , Rfl:  .  folic acid (FOLVITE) 1 MG tablet, take 1 tablet by mouth once daily, Disp: 90 tablet, Rfl: 4 .  furosemide (LASIX) 20 MG tablet, take 1 tablet by mouth once daily IF NEEDED FOR EDEMA, Disp: 90 tablet, Rfl: 4 .  hydrALAZINE (APRESOLINE) 100 MG tablet, Take 100 mg by mouth 2 (two) times daily., Disp: , Rfl:  .  insulin regular human CONCENTRATED (HUMULIN R) 500 UNIT/ML injection, Inject 25 Units into the skin every morning. Inject 27 units at noom. Inject 29 units at night, Disp: , Rfl:  .  lisinopril (PRINIVIL,ZESTRIL) 20 MG tablet, take 1 tablet by mouth once daily, Disp: 90 tablet, Rfl: 4 .  lovastatin (MEVACOR) 40 MG tablet, take 1 tablet by mouth once daily, Disp: 90 tablet, Rfl: 3 .  metFORMIN (GLUCOPHAGE) 1000 MG tablet, Take 1,000 mg by mouth 2 (two) times daily with a meal., Disp: , Rfl:  .  metoprolol succinate (TOPROL-XL) 100 MG 24 hr tablet, Take 1 tablet (100 mg total) by mouth daily., Disp: 90 tablet, Rfl: 4  Review of Systems  Constitutional: Negative for appetite change, chills and fever.  Respiratory: Negative for chest tightness, shortness of breath and wheezing.   Cardiovascular: Negative for chest pain and palpitations.  Gastrointestinal: Negative for abdominal pain, nausea and vomiting.  Social History  Substance Use Topics  . Smoking status: Former Smoker    Packs/day: 2.00    Years: 35.00    Types: Cigarettes    Quit date: 03/05/2004  . Smokeless tobacco: Never Used     Comment: started smoking at age 53  . Alcohol use No   Objective:   BP 140/64 (BP Location: Right Arm, Patient Position: Sitting, Cuff Size: Large)   Pulse 79   Temp 98.1 F (36.7 C) (Oral)   Resp 16   Ht 6\' 4"  (1.93 m)   Wt (!) 384 lb (174.2 kg)   SpO2 91%   BMI 46.74 kg/m  Vitals:   08/15/16 1628 08/15/16 1644  BP: 140/64 138/68  Pulse: 79   Resp: 16     Temp: 98.1 F (36.7 C)   TempSrc: Oral   SpO2: 91%   Weight: (!) 384 lb (174.2 kg)   Height: 6\' 4"  (1.93 m)      Physical Exam  General Appearance:    Alert, cooperative, no distress, obese  Eyes:    PERRL, conjunctiva/corneas clear, EOM's intact       Lungs:     Clear to auscultation bilaterally, respirations unlabored  Heart:    Regular rate and rhythm  Neurologic:   Awake, alert, oriented x 3. No apparent focal neurological           defect.           Assessment & Plan:      1. Essential hypertension Well controlled.  Continue current medications.    2. Diabetes mellitus without complication (HCC) Well controlled.  Continue regular follow up Dr. Gabriel Carina   3. Hyperlipidemia, unspecified hyperlipidemia type He is tolerating lovastatin well with no adverse effects.    4. History of colon polyps Patient reports he has follow up colonoscopy scheduled at [facility] in July.   Addendum 09/19/2016: Patient is low risk for complications of colonoscopy and anesthesia and is cleared for procedure.       Lelon Huh, MD  [facility]

## 2016-09-13 ENCOUNTER — Telehealth: Payer: Self-pay | Admitting: Family Medicine

## 2016-09-13 NOTE — Telephone Encounter (Signed)
Please review-aa 

## 2016-09-13 NOTE — Telephone Encounter (Signed)
Robin with [facility] called.  They faxed a cardio clearance to be signed and faxed back for surgery.  Their number is [phone]  Thanks Con Memos

## 2016-09-24 ENCOUNTER — Ambulatory Visit: Payer: Medicare Other | Admitting: Anesthesiology

## 2016-09-24 ENCOUNTER — Ambulatory Visit
Admission: RE | Admit: 2016-09-24 | Discharge: 2016-09-24 | Disposition: A | Discharge status: Home / Self Care | Payer: Medicare Other | Source: Ambulatory Visit | Attending: Unknown Physician Specialty | Admitting: Unknown Physician Specialty

## 2016-09-24 ENCOUNTER — Encounter
Admission: RE | Disposition: A | Discharge status: Home / Self Care | Payer: Self-pay | Source: Ambulatory Visit | Attending: Unknown Physician Specialty

## 2016-09-24 DIAGNOSIS — Z794 Long term (current) use of insulin: Secondary | ICD-10-CM | POA: Diagnosis not present

## 2016-09-24 DIAGNOSIS — Z7984 Long term (current) use of oral hypoglycemic drugs: Secondary | ICD-10-CM | POA: Diagnosis not present

## 2016-09-24 DIAGNOSIS — Z87891 Personal history of nicotine dependence: Secondary | ICD-10-CM | POA: Insufficient documentation

## 2016-09-24 DIAGNOSIS — E119 Type 2 diabetes mellitus without complications: Secondary | ICD-10-CM | POA: Diagnosis not present

## 2016-09-24 DIAGNOSIS — K635 Polyp of colon: Secondary | ICD-10-CM | POA: Diagnosis not present

## 2016-09-24 DIAGNOSIS — K621 Rectal polyp: Secondary | ICD-10-CM | POA: Diagnosis not present

## 2016-09-24 DIAGNOSIS — I110 Hypertensive heart disease with heart failure: Secondary | ICD-10-CM | POA: Insufficient documentation

## 2016-09-24 DIAGNOSIS — K640 First degree hemorrhoids: Secondary | ICD-10-CM | POA: Insufficient documentation

## 2016-09-24 DIAGNOSIS — Z7982 Long term (current) use of aspirin: Secondary | ICD-10-CM | POA: Insufficient documentation

## 2016-09-24 DIAGNOSIS — E785 Hyperlipidemia, unspecified: Secondary | ICD-10-CM | POA: Diagnosis not present

## 2016-09-24 DIAGNOSIS — Z86010 Personal history of colonic polyps: Secondary | ICD-10-CM | POA: Diagnosis not present

## 2016-09-24 DIAGNOSIS — I509 Heart failure, unspecified: Secondary | ICD-10-CM | POA: Diagnosis not present

## 2016-09-24 DIAGNOSIS — Z1211 Encounter for screening for malignant neoplasm of colon: Secondary | ICD-10-CM | POA: Insufficient documentation

## 2016-09-24 DIAGNOSIS — D128 Benign neoplasm of rectum: Secondary | ICD-10-CM | POA: Diagnosis not present

## 2016-09-24 HISTORY — PX: COLONOSCOPY WITH PROPOFOL: SHX5780

## 2016-09-24 LAB — GLUCOSE, CAPILLARY: Glucose-Capillary: 141 mg/dL — ABNORMAL HIGH (ref 65–99)

## 2016-09-24 SURGERY — COLONOSCOPY WITH PROPOFOL
Anesthesia: General

## 2016-09-24 MED ORDER — PROPOFOL 500 MG/50ML IV EMUL
INTRAVENOUS | Status: AC
  Filled 2016-09-24: qty 50

## 2016-09-24 MED ORDER — PROPOFOL 10 MG/ML IV BOLUS
INTRAVENOUS | Status: AC
  Filled 2016-09-24: qty 20

## 2016-09-24 MED ORDER — EPHEDRINE SULFATE 50 MG/ML IJ SOLN
INTRAMUSCULAR | Status: DC | PRN
  Administered 2016-09-24: 10 mg via INTRAVENOUS
  Administered 2016-09-24 (×3): 5 mg via INTRAVENOUS

## 2016-09-24 MED ORDER — PROPOFOL 10 MG/ML IV BOLUS
INTRAVENOUS | Status: DC | PRN
  Administered 2016-09-24: 20 mg via INTRAVENOUS
  Administered 2016-09-24: 30 mg via INTRAVENOUS

## 2016-09-24 MED ORDER — SODIUM CHLORIDE 0.9 % IV SOLN
INTRAVENOUS | Status: DC
  Administered 2016-09-24: 08:00:00 via INTRAVENOUS

## 2016-09-24 MED ORDER — FENTANYL CITRATE (PF) 100 MCG/2ML IJ SOLN: 25.0000 ug | INTRAMUSCULAR | Status: DC | PRN

## 2016-09-24 MED ORDER — PROPOFOL 500 MG/50ML IV EMUL
INTRAVENOUS | Status: DC | PRN
  Administered 2016-09-24: 75 ug/kg/min via INTRAVENOUS

## 2016-09-24 MED ORDER — FENTANYL CITRATE (PF) 100 MCG/2ML IJ SOLN
INTRAMUSCULAR | Status: AC
  Filled 2016-09-24: qty 2

## 2016-09-24 MED ORDER — LIDOCAINE HCL (PF) 2 % IJ SOLN
INTRAMUSCULAR | Status: DC | PRN
  Administered 2016-09-24: 60 mg

## 2016-09-24 MED ORDER — ONDANSETRON HCL 4 MG/2ML IJ SOLN: 4.0000 mg | Freq: Once | INTRAMUSCULAR | Status: DC | PRN

## 2016-09-24 MED ORDER — SODIUM CHLORIDE 0.9 % IV SOLN: INTRAVENOUS | Status: DC

## 2016-09-24 MED ORDER — MIDAZOLAM HCL 2 MG/2ML IJ SOLN
INTRAMUSCULAR | Status: AC
  Filled 2016-09-24: qty 2

## 2016-09-24 MED ORDER — MIDAZOLAM HCL 5 MG/5ML IJ SOLN
INTRAMUSCULAR | Status: DC | PRN
  Administered 2016-09-24 (×2): 1 mg via INTRAVENOUS

## 2016-09-24 NOTE — Op Note (Signed)
[facility] Patient Name: Scott Simpson Procedure Date: 09/24/2016 7:34 AM MRN: [id number] Account #: [id number] Date of Birth: 01-03-1946 Admit Type: Outpatient Age: 71 Room: [facility] ENDO ROOM 3 Gender: Male Note Status: Finalized Procedure:            Colonoscopy Indications:          High risk colon cancer surveillance: Personal history                        of colonic polyps Providers:            [name], MD Referring MD:         Kirstie Peri. Caryn Section, MD (Referring MD) Medicines:            Propofol per Anesthesia Complications:        No immediate complications. Procedure:            Pre-Anesthesia Assessment:                       - After reviewing the risks and benefits, the patient                        was deemed in satisfactory condition to undergo the                        procedure.                       After obtaining informed consent, the colonoscope was                        passed under direct vision. Throughout the procedure,                        the patient's blood pressure, pulse, and oxygen                        saturations were monitored continuously. The                        Colonoscope was introduced through the anus and                        advanced to the the cecum, identified by appendiceal                        orifice and ileocecal valve. The colonoscopy was                        performed with difficulty due to inadequate bowel prep.                        The patient tolerated the procedure well. The quality                        of the bowel preparation was unsatisfactory. Findings:      There was a lot of liquid stool which made exam extremely difficult and       mostly impossible.      A small polyp was found in the hepatic flexure. The polyp was sessile.  The polyp was removed with a hot snare. Polyp resection was incomplete,       and the resected tissue was not retrieved.      A  diminutive polyp was found in the rectum. The polyp was sessile. The       polyp was removed with a jumbo cold forceps. Resection and retrieval       were complete.      Internal hemorrhoids were found during endoscopy. The hemorrhoids were       small and Grade I (internal hemorrhoids that do not prolapse).      The exam was otherwise without abnormality. Impression:           - Preparation of the colon was unsatisfactory.                       - One small polyp at the hepatic flexure, removed with                        a hot snare. Incomplete resection. Resected tissue not                        retrieved.                       - One diminutive polyp in the rectum, removed with a                        jumbo cold forceps. Resected and retrieved.                       - Internal hemorrhoids.                       - The examination was otherwise normal. Recommendation:       - Await pathology results. [name], MD 09/24/2016 8:05:20 AM This report has been signed electronically. Number of Addenda: 0 Note Initiated On: 09/24/2016 7:34 AM Scope Withdrawal Time: 0 hours 6 minutes 57 seconds  Total Procedure Duration: 0 hours 21 minutes 7 seconds       [facility]

## 2016-09-24 NOTE — Anesthesia Preprocedure Evaluation (Addendum)
Anesthesia Evaluation  Patient identified by MRN, date of birth, ID band Patient awake    Reviewed: Allergy & Precautions, NPO status , Patient's Chart, lab work & pertinent test results, reviewed documented beta blocker date and time   Airway Mallampati: II  TM Distance: >3 FB     Dental   Pulmonary former smoker,    Pulmonary exam normal        Cardiovascular hypertension, Pt. on medications and Pt. on home beta blockers +CHF  Normal cardiovascular exam     Neuro/Psych negative neurological ROS  negative psych ROS   GI/Hepatic Neg liver ROS, Hx of prior polyps   Endo/Other  diabetes, Well Controlled, Type 2, Oral Hypoglycemic AgentsMorbid obesity  Renal/GU stones  negative genitourinary   Musculoskeletal negative musculoskeletal ROS (+)   Abdominal   Peds negative pediatric ROS (+)  Hematology negative hematology ROS (+)   Anesthesia Other Findings Past Medical History: No date: Diabetes mellitus without complication (HCC)     Comment:  type 2 No date: History of mumps as a child No date: Hyperlipidemia No date: Hypertension No date: Obesity  Reproductive/Obstetrics                            Anesthesia Physical Anesthesia Plan  ASA: III  Anesthesia Plan: General   Post-op Pain Management:    Induction: Intravenous  PONV Risk Score and Plan:   Airway Management Planned: Nasal Cannula  Additional Equipment:   Intra-op Plan:   Post-operative Plan:   Informed Consent: I have reviewed the patients History and Physical, chart, labs and discussed the procedure including the risks, benefits and alternatives for the proposed anesthesia with the patient or authorized representative who has indicated his/her understanding and acceptance.   Dental advisory given  Plan Discussed with: CRNA and Surgeon  Anesthesia Plan Comments:         Anesthesia Quick Evaluation

## 2016-09-24 NOTE — Anesthesia Postprocedure Evaluation (Signed)
Anesthesia Post Note  Patient: Scott Simpson  Procedure(s) Performed: Procedure(s) (LRB): COLONOSCOPY WITH PROPOFOL (N/A)  Patient location during evaluation: PACU Anesthesia Type: General Level of consciousness: awake and alert and oriented Pain management: pain level controlled Vital Signs Assessment: post-procedure vital signs reviewed and stable Respiratory status: spontaneous breathing Cardiovascular status: blood pressure returned to baseline Anesthetic complications: no     Last Vitals:  Vitals:   09/24/16 0826 09/24/16 0836  BP: (!) 137/51 128/61  Pulse: 75 75  Resp: 15 20  Temp:      Last Pain:  Vitals:   09/24/16 0806  TempSrc: Tympanic                 Shaquna Geigle

## 2016-09-24 NOTE — Anesthesia Post-op Follow-up Note (Cosign Needed)
Anesthesia QCDR form completed.        

## 2016-09-24 NOTE — Transfer of Care (Signed)
Immediate Anesthesia Transfer of Care Note  Patient: Scott Simpson  Procedure(s) Performed: Procedure(s): COLONOSCOPY WITH PROPOFOL (N/A)  Patient Location: PACU  Anesthesia Type:General  Level of Consciousness: sedated  Airway & Oxygen Therapy: Patient Spontanous Breathing and Patient connected to nasal cannula oxygen  Post-op Assessment: Report given to RN and Post -op Vital signs reviewed and stable  Post vital signs: Reviewed and stable  Last Vitals: There were no vitals filed for this visit.  Last Pain: There were no vitals filed for this visit.       Complications: No apparent anesthesia complications
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
## 2016-09-25 ENCOUNTER — Encounter: Payer: Self-pay | Admitting: Unknown Physician Specialty

## 2016-09-25 LAB — SURGICAL PATHOLOGY

## 2016-10-16 ENCOUNTER — Encounter: Payer: Self-pay | Admitting: Family Medicine

## 2016-10-17 DIAGNOSIS — E1129 Type 2 diabetes mellitus with other diabetic kidney complication: Secondary | ICD-10-CM | POA: Diagnosis not present

## 2016-10-17 DIAGNOSIS — Z794 Long term (current) use of insulin: Secondary | ICD-10-CM | POA: Diagnosis not present

## 2016-10-17 DIAGNOSIS — R809 Proteinuria, unspecified: Secondary | ICD-10-CM | POA: Diagnosis not present

## 2016-10-17 LAB — ALBUMIN, URINE, RANDOM
Albumin/Creatinine Ratio, Urine, POC: 365.9
CREATININE UR: 83.9 mg/dL
Microalbumin, Urine: 307

## 2016-10-24 DIAGNOSIS — R809 Proteinuria, unspecified: Secondary | ICD-10-CM | POA: Diagnosis not present

## 2016-10-24 DIAGNOSIS — Z794 Long term (current) use of insulin: Secondary | ICD-10-CM | POA: Diagnosis not present

## 2016-10-24 DIAGNOSIS — E1129 Type 2 diabetes mellitus with other diabetic kidney complication: Secondary | ICD-10-CM | POA: Diagnosis not present

## 2016-10-24 DIAGNOSIS — I10 Essential (primary) hypertension: Secondary | ICD-10-CM | POA: Diagnosis not present

## 2016-12-10 DIAGNOSIS — Z794 Long term (current) use of insulin: Secondary | ICD-10-CM | POA: Diagnosis not present

## 2016-12-10 DIAGNOSIS — B351 Tinea unguium: Secondary | ICD-10-CM | POA: Diagnosis not present

## 2016-12-10 DIAGNOSIS — E1142 Type 2 diabetes mellitus with diabetic polyneuropathy: Secondary | ICD-10-CM | POA: Diagnosis not present

## 2017-02-05 ENCOUNTER — Ambulatory Visit (INDEPENDENT_AMBULATORY_CARE_PROVIDER_SITE_OTHER): Payer: Medicare Other | Admitting: Family Medicine

## 2017-02-05 ENCOUNTER — Encounter: Payer: Self-pay | Admitting: Family Medicine

## 2017-02-05 VITALS — BP 140/58 | HR 79 | Temp 98.0°F | Resp 18 | Ht 76.0 in | Wt 382.0 lb

## 2017-02-05 DIAGNOSIS — E785 Hyperlipidemia, unspecified: Secondary | ICD-10-CM

## 2017-02-05 DIAGNOSIS — I10 Essential (primary) hypertension: Secondary | ICD-10-CM | POA: Diagnosis not present

## 2017-02-05 DIAGNOSIS — Z23 Encounter for immunization: Secondary | ICD-10-CM | POA: Diagnosis not present

## 2017-02-05 NOTE — Progress Notes (Signed)
p      Patient: Scott Simpson Male    DOB: [birth year]   71 y.o.   MRN: [id number] Visit Date: 02/05/2017  Today's Provider: Lelon Huh, MD   No chief complaint on file.  Subjective:    HPI   Diabetes Mellitus Type II, Follow-up:   Lab Results  Component Value Date   HGBA1C 6.3 04/16/2016   HGBA1C 6.5 04/27/2015   Last seen for diabetes 6 months ago.  Management since then includes; no changes. He reports good compliance with treatment. He is not having side effects.   He continue routine follow up with Dr. Gabriel Carina. Last A1c was 6.4.   ------------------------------------------------------------------------   Hypertension, follow-up:  BP Readings from Last 3 Encounters:  09/24/16 128/61  08/15/16 138/68  05/28/16 (!) 150/80    He was last seen for hypertension 6 months ago.  BP at that visit was 140/64. Management since that visit includes; no changes.He reports good compliance with treatment.  ------------------------------------------------------------------------    Lipid/Cholesterol, Follow-up:   Last seen for this 6 months ago.  Management since that visit includes; no changes.  Last Lipid Panel:    Component Value Date/Time   CHOL 134 11/21/2015 1020   TRIG 277 (H) 11/21/2015 1020   HDL 29 (L) 11/21/2015 1020   CHOLHDL 4.6 11/21/2015 1020   LDLCALC 50 11/21/2015 1020    He reports good compliance with treatment. He is not having side effects.   Wt Readings from Last 3 Encounters:  08/15/16 (!) 384 lb (174.2 kg)  05/28/16 (!) 372 lb (168.7 kg)  04/18/16 (!) 377 lb 12.8 oz (171.4 kg)    ------------------------------------------------------------------------    No Known Allergies   Current Outpatient Medications:  .  amLODipine (NORVASC) 5 MG tablet, take 1 tablet by mouth once daily, Disp: 30 tablet, Rfl: 12 .  aspirin 81 MG tablet, Take 81 mg by mouth daily., Disp: , Rfl:  .  canagliflozin (INVOKANA) 300 MG TABS tablet, Take 1  tablet by mouth daily., Disp: , Rfl:  .  folic acid (FOLVITE) 1 MG tablet, take 1 tablet by mouth once daily, Disp: 90 tablet, Rfl: 4 .  furosemide (LASIX) 20 MG tablet, take 1 tablet by mouth once daily IF NEEDED FOR EDEMA, Disp: 90 tablet, Rfl: 4 .  hydrALAZINE (APRESOLINE) 100 MG tablet, Take 100 mg by mouth 2 (two) times daily., Disp: , Rfl:  .  insulin regular human CONCENTRATED (HUMULIN R) 500 UNIT/ML injection, Inject 25 Units into the skin every morning. Inject 27 units at noom. Inject 29 units at night, Disp: , Rfl:  .  lisinopril (PRINIVIL,ZESTRIL) 20 MG tablet, take 1 tablet by mouth once daily, Disp: 90 tablet, Rfl: 4 .  lovastatin (MEVACOR) 40 MG tablet, take 1 tablet by mouth once daily, Disp: 90 tablet, Rfl: 3 .  metFORMIN (GLUCOPHAGE) 1000 MG tablet, Take 1,000 mg by mouth 2 (two) times daily with a meal., Disp: , Rfl:  .  metoprolol succinate (TOPROL-XL) 100 MG 24 hr tablet, Take 1 tablet (100 mg total) by mouth daily., Disp: 90 tablet, Rfl: 4  Review of Systems  Constitutional: Negative for appetite change, chills and fever.  Respiratory: Negative for chest tightness, shortness of breath and wheezing.   Cardiovascular: Negative for chest pain and palpitations.  Gastrointestinal: Negative for abdominal pain, nausea and vomiting.    Social History   Tobacco Use  . Smoking status: Former Smoker    Packs/day: 2.00    Years: 35.00  Pack years: 70.00    Types: Cigarettes    Last attempt to quit: 03/05/2004    Years since quitting: 12.9  . Smokeless tobacco: Never Used  . Tobacco comment: started smoking at age 40  Substance Use Topics  . Alcohol use: No    Alcohol/week: 0.0 oz   Objective:   BP (!) 140/58 (BP Location: Right Arm, Patient Position: Sitting, Cuff Size: Large)   Pulse 79   Temp 98 F (36.7 C) (Oral)   Resp 18   Ht 6\' 4"  (1.93 m)   Wt (!) 382 lb (173.3 kg)   SpO2 92%   BMI 46.50 kg/m     Physical Exam   General Appearance:    Alert,  cooperative, no distress  Eyes:    PERRL, conjunctiva/corneas clear, EOM's intact       Lungs:     Clear to auscultation bilaterally, respirations unlabored  Heart:    Regular rate and rhythm  Neurologic:   Awake, alert, oriented x 3. No apparent focal neurological           defect.           Assessment & Plan:     1. Essential hypertension Well controlled.  Continue current medications.    2. Hyperlipidemia, unspecified hyperlipidemia type Due to check  LDL. Lab is closed today and his wife states that can't get him back up to office for labs. Send with order and request that LDL be done with next lab draw at [facility] endocrinology.  - Direct LDL - Comprehensive metabolic panel  3. Need for influenza vaccination  - Flu vaccine HIGH DOSE PF  Follow up 6 months.       Lelon Huh, MD  [facility]

## 2017-02-08 ENCOUNTER — Other Ambulatory Visit: Payer: Self-pay | Admitting: Family Medicine

## 2017-04-22 ENCOUNTER — Other Ambulatory Visit: Payer: Self-pay | Admitting: Family Medicine

## 2017-04-22 DIAGNOSIS — Z794 Long term (current) use of insulin: Secondary | ICD-10-CM | POA: Diagnosis not present

## 2017-04-22 DIAGNOSIS — E785 Hyperlipidemia, unspecified: Secondary | ICD-10-CM | POA: Diagnosis not present

## 2017-04-22 DIAGNOSIS — R809 Proteinuria, unspecified: Secondary | ICD-10-CM | POA: Diagnosis not present

## 2017-04-22 DIAGNOSIS — E1129 Type 2 diabetes mellitus with other diabetic kidney complication: Secondary | ICD-10-CM | POA: Diagnosis not present

## 2017-04-29 DIAGNOSIS — E11649 Type 2 diabetes mellitus with hypoglycemia without coma: Secondary | ICD-10-CM | POA: Diagnosis not present

## 2017-04-29 DIAGNOSIS — R809 Proteinuria, unspecified: Secondary | ICD-10-CM | POA: Diagnosis not present

## 2017-04-29 DIAGNOSIS — Z794 Long term (current) use of insulin: Secondary | ICD-10-CM | POA: Diagnosis not present

## 2017-04-29 DIAGNOSIS — E1129 Type 2 diabetes mellitus with other diabetic kidney complication: Secondary | ICD-10-CM | POA: Diagnosis not present

## 2017-05-10 ENCOUNTER — Other Ambulatory Visit: Payer: Self-pay | Admitting: Family Medicine

## 2017-06-03 ENCOUNTER — Other Ambulatory Visit: Payer: Self-pay | Admitting: Family Medicine

## 2017-06-17 DIAGNOSIS — E1142 Type 2 diabetes mellitus with diabetic polyneuropathy: Secondary | ICD-10-CM | POA: Diagnosis not present

## 2017-06-17 DIAGNOSIS — B351 Tinea unguium: Secondary | ICD-10-CM | POA: Diagnosis not present

## 2017-06-17 DIAGNOSIS — Z794 Long term (current) use of insulin: Secondary | ICD-10-CM | POA: Diagnosis not present

## 2017-07-03 ENCOUNTER — Other Ambulatory Visit: Payer: Self-pay | Admitting: Family Medicine

## 2017-08-05 NOTE — Progress Notes (Addendum)
Patient: Scott Simpson Male    DOB: 27-Mar-1945   72 y.o.   MRN: [id number] Visit Date: 08/06/2017  Today's Provider: Lelon Huh, MD   Chief Complaint  Patient presents with  . Follow-up  . Hypertension  . Hyperlipidemia   Subjective:   Patient saw [name] for AWV today at 2:20 pm.  HPI   Hypertension, follow-up:  BP Readings from Last 3 Encounters:  08/06/17 136/62  02/05/17 (!) 140/58  09/24/16 128/61    He was last seen for hypertension 6 months ago.  BP at that visit was 140/58. Management since that visit includes; no changes.He reports good compliance with treatment. He is not having side effects. none He is not exercising. He is adherent to low salt diet.   Outside blood pressures are normal. He is experiencing none.  Patient denies none.   Cardiovascular risk factors include advanced age (older than 41 for men, 37 for women).  Use of agents associated with hypertension: none.   ----------------------------------------------------------------    Lipid/Cholesterol, Follow-up:   Last seen for this 6 months ago.  Management since that visit includes; labs ordered, but zero were done.  Last Lipid Panel:    Component Value Date/Time   CHOL 134 11/21/2015 1020   TRIG 277 (H) 11/21/2015 1020   HDL 29 (L) 11/21/2015 1020   CHOLHDL 4.6 11/21/2015 1020   LDLCALC 50 11/21/2015 1020    He reports good compliance with treatment. He is not having side effects. none  Wt Readings from Last 3 Encounters:  08/06/17 (!) 379 lb 6.4 oz (172.1 kg)  02/05/17 (!) 382 lb (173.3 kg)  08/15/16 (!) 384 lb (174.2 kg)    ----------------------------------------------------------------  He continues routine follow up with Dr. Gabriel Carina for diabetes with last a1c = 6.3% in February.   His wife reports that he does get short of breath with very little exertion. No chest pains, palpitations, or orthopnea. No coughing or wheezing.   No Known Allergies   Current  Outpatient Medications:  .  amLODipine (NORVASC) 5 MG tablet, TAKE 1 TABLET BY MOUTH ONCE DAILY, Disp: 30 tablet, Rfl: 12 .  aspirin 81 MG tablet, Take 81 mg by mouth daily., Disp: , Rfl:  .  empagliflozin (JARDIANCE) 25 MG TABS tablet, Take 25 mg by mouth daily. , Disp: , Rfl:  .  folic acid (FOLVITE) 1 MG tablet, TAKE 1 TABLET BY MOUTH ONCE DAILY, Disp: 90 tablet, Rfl: 4 .  furosemide (LASIX) 20 MG tablet, TAKE 1 TABLET BY MOUTH ONCE DAILY FOR EDEMA., Disp: 90 tablet, Rfl: 4 .  hydrALAZINE (APRESOLINE) 100 MG tablet, Take 100 mg by mouth 2 (two) times daily., Disp: , Rfl:  .  insulin regular human CONCENTRATED (HUMULIN R) 500 UNIT/ML injection, Inject 25 Units into the skin every morning. Inject 27 units at noom. Inject 29 units at night, Disp: , Rfl:  .  lisinopril (PRINIVIL,ZESTRIL) 20 MG tablet, TAKE 1 TABLET BY MOUTH ONCE DAILY, Disp: 270 tablet, Rfl: 4 .  lovastatin (MEVACOR) 40 MG tablet, TAKE 1 TABLET BY MOUTH ONCE DAILY, Disp: 90 tablet, Rfl: 4 .  metFORMIN (GLUCOPHAGE) 1000 MG tablet, Take 1,000 mg by mouth 2 (two) times daily with a meal., Disp: , Rfl:  .  metoprolol succinate (TOPROL-XL) 100 MG 24 hr tablet, TAKE 1 TABLET BY MOUTH ONCE DAILY, Disp: 90 tablet, Rfl: 4  Review of Systems  Respiratory: Positive for shortness of breath.   Endocrine: Positive for heat intolerance,  polydipsia and polyuria.  Musculoskeletal: Positive for arthralgias, back pain and gait problem.  Neurological: Positive for numbness.  Psychiatric/Behavioral: Positive for agitation and decreased concentration.  All other systems reviewed and are negative.   Social History   Tobacco Use  . Smoking status: Former Smoker    Packs/day: 2.00    Years: 35.00    Pack years: 70.00    Types: Cigarettes    Last attempt to quit: 03/05/2004    Years since quitting: 13.4  . Smokeless tobacco: Never Used  . Tobacco comment: started smoking at age 74  Substance Use Topics  . Alcohol use: No    Alcohol/week: 0.0  oz   Objective:   Vitals:   BP 136/62 (BP Location: Right Arm)   Pulse 78   Temp 98.7 F (37.1 C) (Oral)   Ht 6\' 4"  (1.93 m)   Wt 379 lb 6.4 oz (172.1 kg)    BMI 46.18 kg/m   BSA 3.04 m         Physical Exam   General Appearance:    Alert, cooperative, no distress, morbidly obese.   Eyes:    PERRL, conjunctiva/corneas clear, EOM's intact       Lungs:     Clear to auscultation bilaterally, respirations unlabored  Heart:    Regular rate and rhythm  Neurologic:   Awake, alert, oriented x 3. No apparent focal neurological           defect.           Assessment & Plan:     1. Hyperlipidemia, unspecified hyperlipidemia type Is not able to go to the lab when fasting, will check - Direct LDL  2. Prostate cancer screening  - PSA  3. Dyspnea on exertion Likely do to obesity and poor conditioning.  - Brain natriuretic peptide  4. Diabetes mellitus without complication ([place]) Continue routine follow up Dr. Nilda Simmer  5. Chronic diastolic heart failure ([facility]) Check BNP as above.   6. Morbid obesity ([place]) Non-compliant with diet and exercise.        Lelon Huh, MD  [facility]

## 2017-08-06 ENCOUNTER — Ambulatory Visit (INDEPENDENT_AMBULATORY_CARE_PROVIDER_SITE_OTHER): Payer: Medicare Other | Admitting: Family Medicine

## 2017-08-06 ENCOUNTER — Ambulatory Visit: Payer: Self-pay | Admitting: Family Medicine

## 2017-08-06 ENCOUNTER — Ambulatory Visit (INDEPENDENT_AMBULATORY_CARE_PROVIDER_SITE_OTHER): Payer: Medicare Other

## 2017-08-06 VITALS — BP 136/62 | HR 78 | Temp 98.7°F | Ht 76.0 in | Wt 379.4 lb

## 2017-08-06 DIAGNOSIS — Z0000 Encounter for general adult medical examination without abnormal findings: Secondary | ICD-10-CM | POA: Diagnosis not present

## 2017-08-06 DIAGNOSIS — I5032 Chronic diastolic (congestive) heart failure: Secondary | ICD-10-CM

## 2017-08-06 DIAGNOSIS — E785 Hyperlipidemia, unspecified: Secondary | ICD-10-CM | POA: Diagnosis not present

## 2017-08-06 DIAGNOSIS — Z125 Encounter for screening for malignant neoplasm of prostate: Secondary | ICD-10-CM | POA: Diagnosis not present

## 2017-08-06 DIAGNOSIS — E119 Type 2 diabetes mellitus without complications: Secondary | ICD-10-CM

## 2017-08-06 DIAGNOSIS — R0609 Other forms of dyspnea: Secondary | ICD-10-CM

## 2017-08-06 NOTE — Addendum Note (Signed)
Addended by: Birdie Sons on: 08/06/2017 03:54 PM   Modules accepted: Level of Service

## 2017-08-06 NOTE — Patient Instructions (Addendum)
Mr. Scott Simpson , Thank you for taking time to come for your Medicare Wellness Visit. I appreciate your ongoing commitment to your health goals. Please review the following plan we discussed and let me know if I can assist you in the future.   Screening recommendations/referrals: Colonoscopy: Up to date Recommended yearly ophthalmology/optometry visit for glaucoma screening and checkup Recommended yearly dental visit for hygiene and checkup  Vaccinations: Influenza vaccine: Up to date Pneumococcal vaccine: Up to date Tdap vaccine: Pt declines today.  Shingles vaccine: Pt declines today.     Advanced directives: Please bring a copy of your POA (Power of Attorney) and/or Living Will to your next appointment.   Conditions/risks identified: Obesity- recommend decreasing portion sizes by half and eating smaller and more frequent healthy meals.   Next appointment: 3:00 PM today   Preventive Care 65 Years and Older, Male Preventive care refers to lifestyle choices and visits with your health care provider that can promote health and wellness. What does preventive care include?  A yearly physical exam. This is also called an annual well check.  Dental exams once or twice a year.  Routine eye exams. Ask your health care provider how often you should have your eyes checked.  Personal lifestyle choices, including:  Daily care of your teeth and gums.  Regular physical activity.  Eating a healthy diet.  Avoiding tobacco and drug use.  Limiting alcohol use.  Practicing safe sex.  Taking low doses of aspirin every day.  Taking vitamin and mineral supplements as recommended by your health care provider. What happens during an annual well check? The services and screenings done by your health care provider during your annual well check will depend on your age, overall health, lifestyle risk factors, and family history of disease. Counseling  Your health care provider may ask you questions  about your:  Alcohol use.  Tobacco use.  Drug use.  Emotional well-being.  Home and relationship well-being.  Sexual activity.  Eating habits.  History of falls.  Memory and ability to understand (cognition).  Work and work Statistician. Screening  You may have the following tests or measurements:  Height, weight, and BMI.  Blood pressure.  Lipid and cholesterol levels. These may be checked every 5 years, or more frequently if you are over 63 years old.  Skin check.  Lung cancer screening. You may have this screening every year starting at age 59 if you have a 30-pack-year history of smoking and currently smoke or have quit within the past 15 years.  Fecal occult blood test (FOBT) of the stool. You may have this test every year starting at age 20.  Flexible sigmoidoscopy or colonoscopy. You may have a sigmoidoscopy every 5 years or a colonoscopy every 10 years starting at age 55.  Prostate cancer screening. Recommendations will vary depending on your family history and other risks.  Hepatitis C blood test.  Hepatitis B blood test.  Sexually transmitted disease (STD) testing.  Diabetes screening. This is done by checking your blood sugar (glucose) after you have not eaten for a while (fasting). You may have this done every 1-3 years.  Abdominal aortic aneurysm (AAA) screening. You may need this if you are a current or former smoker.  Osteoporosis. You may be screened starting at age 45 if you are at high risk. Talk with your health care provider about your test results, treatment options, and if necessary, the need for more tests. Vaccines  Your health care provider may recommend certain  vaccines, such as:  Influenza vaccine. This is recommended every year.  Tetanus, diphtheria, and acellular pertussis (Tdap, Td) vaccine. You may need a Td booster every 10 years.  Zoster vaccine. You may need this after age 32.  Pneumococcal 13-valent conjugate (PCV13)  vaccine. One dose is recommended after age 54.  Pneumococcal polysaccharide (PPSV23) vaccine. One dose is recommended after age 17. Talk to your health care provider about which screenings and vaccines you need and how often you need them. This information is not intended to replace advice given to you by your health care provider. Make sure you discuss any questions you have with your health care provider. Document Released: 03/18/2015 Document Revised: 11/09/2015 Document Reviewed: 12/21/2014 Elsevier Interactive Patient Education  2017 [place] Prevention in the Home Falls can cause injuries. They can happen to people of all ages. There are many things you can do to make your home safe and to help prevent falls. What can I do on the outside of my home?  Regularly fix the edges of walkways and driveways and fix any cracks.  Remove anything that might make you trip as you walk through a door, such as a raised step or threshold.  Trim any bushes or trees on the path to your home.  Use bright outdoor lighting.  Clear any walking paths of anything that might make someone trip, such as rocks or tools.  Regularly check to see if handrails are loose or broken. Make sure that both sides of any steps have handrails.  Any raised decks and porches should have guardrails on the edges.  Have any leaves, snow, or ice cleared regularly.  Use sand or salt on walking paths during winter.  Clean up any spills in your garage right away. This includes oil or grease spills. What can I do in the bathroom?  Use night lights.  Install grab bars by the toilet and in the tub and shower. Do not use towel bars as grab bars.  Use non-skid mats or decals in the tub or shower.  If you need to sit down in the shower, use a plastic, non-slip stool.  Keep the floor dry. Clean up any water that spills on the floor as soon as it happens.  Remove soap buildup in the tub or shower  regularly.  Attach bath mats securely with double-sided non-slip rug tape.  Do not have throw rugs and other things on the floor that can make you trip. What can I do in the bedroom?  Use night lights.  Make sure that you have a light by your bed that is easy to reach.  Do not use any sheets or blankets that are too big for your bed. They should not hang down onto the floor.  Have a firm chair that has side arms. You can use this for support while you get dressed.  Do not have throw rugs and other things on the floor that can make you trip. What can I do in the kitchen?  Clean up any spills right away.  Avoid walking on wet floors.  Keep items that you use a lot in easy-to-reach places.  If you need to reach something above you, use a strong step stool that has a grab bar.  Keep electrical cords out of the way.  Do not use floor polish or wax that makes floors slippery. If you must use wax, use non-skid floor wax.  Do not have throw rugs and other things  on the floor that can make you trip. What can I do with my stairs?  Do not leave any items on the stairs.  Make sure that there are handrails on both sides of the stairs and use them. Fix handrails that are broken or loose. Make sure that handrails are as long as the stairways.  Check any carpeting to make sure that it is firmly attached to the stairs. Fix any carpet that is loose or worn.  Avoid having throw rugs at the top or bottom of the stairs. If you do have throw rugs, attach them to the floor with carpet tape.  Make sure that you have a light switch at the top of the stairs and the bottom of the stairs. If you do not have them, ask someone to add them for you. What else can I do to help prevent falls?  Wear shoes that:  Do not have high heels.  Have rubber bottoms.  Are comfortable and fit you well.  Are closed at the toe. Do not wear sandals.  If you use a stepladder:  Make sure that it is fully  opened. Do not climb a closed stepladder.  Make sure that both sides of the stepladder are locked into place.  Ask someone to hold it for you, if possible.  Clearly mark and make sure that you can see:  Any grab bars or handrails.  First and last steps.  Where the edge of each step is.  Use tools that help you move around (mobility aids) if they are needed. These include:  Canes.  Walkers.  Scooters.  Crutches.  Turn on the lights when you go into a dark area. Replace any light bulbs as soon as they burn out.  Set up your furniture so you have a clear path. Avoid moving your furniture around.  If any of your floors are uneven, fix them.  If there are any pets around you, be aware of where they are.  Review your medicines with your doctor. Some medicines can make you feel dizzy. This can increase your chance of falling. Ask your doctor what other things that you can do to help prevent falls. This information is not intended to replace advice given to you by your health care provider. Make sure you discuss any questions you have with your health care provider. Document Released: 12/16/2008 Document Revised: 07/28/2015 Document Reviewed: 03/26/2014 Elsevier Interactive Patient Education  2017 [employer].
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
## 2017-08-06 NOTE — Patient Instructions (Addendum)
   The CDC recommends two doses of Shingrix (the shingles vaccine) separated by 2 to 6 months for adults age 70 years and older. I recommend checking with your insurance plan regarding coverage for this vaccine.   

## 2017-08-07 ENCOUNTER — Telehealth: Payer: Self-pay

## 2017-08-07 LAB — LDL CHOLESTEROL, DIRECT: LDL DIRECT: 71 mg/dL (ref 0–99)

## 2017-08-07 LAB — BRAIN NATRIURETIC PEPTIDE: BNP: 30.5 pg/mL (ref 0.0–100.0)

## 2017-08-07 LAB — PSA: Prostate Specific Ag, Serum: 0.1 ng/mL (ref 0.0–4.0)

## 2017-08-07 NOTE — Telephone Encounter (Signed)
-----   Message from Birdie Sons, MD sent at 08/07/2017  1:56 PM EDT ----- LDL cholesterol is good at 71, normal PSA. Check labs yearly.

## 2017-08-07 NOTE — Telephone Encounter (Signed)
Pt's wife (On DPR) advised.   Thanks,   -Mickel Baas

## 2017-10-22 DIAGNOSIS — R809 Proteinuria, unspecified: Secondary | ICD-10-CM | POA: Diagnosis not present

## 2017-10-22 DIAGNOSIS — E11649 Type 2 diabetes mellitus with hypoglycemia without coma: Secondary | ICD-10-CM | POA: Diagnosis not present

## 2017-10-22 DIAGNOSIS — Z794 Long term (current) use of insulin: Secondary | ICD-10-CM | POA: Diagnosis not present

## 2017-10-22 DIAGNOSIS — E1129 Type 2 diabetes mellitus with other diabetic kidney complication: Secondary | ICD-10-CM | POA: Diagnosis not present

## 2017-10-29 DIAGNOSIS — E11649 Type 2 diabetes mellitus with hypoglycemia without coma: Secondary | ICD-10-CM | POA: Diagnosis not present

## 2017-10-29 DIAGNOSIS — Z794 Long term (current) use of insulin: Secondary | ICD-10-CM | POA: Diagnosis not present

## 2017-10-29 DIAGNOSIS — R809 Proteinuria, unspecified: Secondary | ICD-10-CM | POA: Diagnosis not present

## 2017-10-29 DIAGNOSIS — E1129 Type 2 diabetes mellitus with other diabetic kidney complication: Secondary | ICD-10-CM | POA: Diagnosis not present

## 2017-10-29 DIAGNOSIS — E1169 Type 2 diabetes mellitus with other specified complication: Secondary | ICD-10-CM | POA: Diagnosis not present

## 2017-10-29 DIAGNOSIS — E669 Obesity, unspecified: Secondary | ICD-10-CM | POA: Diagnosis not present

## 2018-01-13 DIAGNOSIS — E1142 Type 2 diabetes mellitus with diabetic polyneuropathy: Secondary | ICD-10-CM | POA: Diagnosis not present

## 2018-01-13 DIAGNOSIS — Z794 Long term (current) use of insulin: Secondary | ICD-10-CM | POA: Diagnosis not present

## 2018-04-15 ENCOUNTER — Other Ambulatory Visit: Payer: Self-pay | Admitting: Family Medicine

## 2018-04-22 DIAGNOSIS — S90212A Contusion of left great toe with damage to nail, initial encounter: Secondary | ICD-10-CM | POA: Diagnosis not present

## 2018-04-22 DIAGNOSIS — E1142 Type 2 diabetes mellitus with diabetic polyneuropathy: Secondary | ICD-10-CM | POA: Diagnosis not present

## 2018-04-22 DIAGNOSIS — Z794 Long term (current) use of insulin: Secondary | ICD-10-CM | POA: Diagnosis not present

## 2018-04-24 LAB — HEMOGLOBIN A1C: Hemoglobin A1C: 6.2

## 2018-04-25 DIAGNOSIS — R809 Proteinuria, unspecified: Secondary | ICD-10-CM | POA: Diagnosis not present

## 2018-04-25 DIAGNOSIS — E1129 Type 2 diabetes mellitus with other diabetic kidney complication: Secondary | ICD-10-CM | POA: Diagnosis not present

## 2018-04-25 DIAGNOSIS — Z794 Long term (current) use of insulin: Secondary | ICD-10-CM | POA: Diagnosis not present

## 2018-05-02 DIAGNOSIS — Z794 Long term (current) use of insulin: Secondary | ICD-10-CM | POA: Diagnosis not present

## 2018-05-02 DIAGNOSIS — E1129 Type 2 diabetes mellitus with other diabetic kidney complication: Secondary | ICD-10-CM | POA: Diagnosis not present

## 2018-05-02 DIAGNOSIS — R809 Proteinuria, unspecified: Secondary | ICD-10-CM | POA: Diagnosis not present

## 2018-05-02 DIAGNOSIS — E1169 Type 2 diabetes mellitus with other specified complication: Secondary | ICD-10-CM | POA: Diagnosis not present

## 2018-05-02 DIAGNOSIS — E669 Obesity, unspecified: Secondary | ICD-10-CM | POA: Diagnosis not present

## 2018-05-05 DIAGNOSIS — S90212A Contusion of left great toe with damage to nail, initial encounter: Secondary | ICD-10-CM | POA: Diagnosis not present

## 2018-05-05 DIAGNOSIS — Z794 Long term (current) use of insulin: Secondary | ICD-10-CM | POA: Diagnosis not present

## 2018-05-05 DIAGNOSIS — E1142 Type 2 diabetes mellitus with diabetic polyneuropathy: Secondary | ICD-10-CM | POA: Diagnosis not present

## 2018-05-17 ENCOUNTER — Other Ambulatory Visit: Payer: Self-pay | Admitting: Family Medicine

## 2018-06-04 ENCOUNTER — Other Ambulatory Visit: Payer: Self-pay | Admitting: Family Medicine

## 2018-06-13 ENCOUNTER — Other Ambulatory Visit: Payer: Self-pay | Admitting: Family Medicine

## 2018-06-26 ENCOUNTER — Other Ambulatory Visit: Payer: Self-pay | Admitting: Family Medicine

## 2018-08-26 ENCOUNTER — Other Ambulatory Visit: Payer: Self-pay | Admitting: Family Medicine

## 2018-10-03 ENCOUNTER — Encounter: Payer: Self-pay | Admitting: Family Medicine

## 2018-10-03 ENCOUNTER — Other Ambulatory Visit: Payer: Self-pay

## 2018-10-03 ENCOUNTER — Ambulatory Visit (INDEPENDENT_AMBULATORY_CARE_PROVIDER_SITE_OTHER): Payer: Medicare Other | Admitting: Family Medicine

## 2018-10-03 VITALS — BP 145/70 | HR 98 | Temp 98.7°F | Ht 77.0 in | Wt 378.0 lb

## 2018-10-03 DIAGNOSIS — E785 Hyperlipidemia, unspecified: Secondary | ICD-10-CM | POA: Diagnosis not present

## 2018-10-03 DIAGNOSIS — I10 Essential (primary) hypertension: Secondary | ICD-10-CM

## 2018-10-03 DIAGNOSIS — I5032 Chronic diastolic (congestive) heart failure: Secondary | ICD-10-CM | POA: Diagnosis not present

## 2018-10-03 DIAGNOSIS — L989 Disorder of the skin and subcutaneous tissue, unspecified: Secondary | ICD-10-CM

## 2018-10-03 DIAGNOSIS — E119 Type 2 diabetes mellitus without complications: Secondary | ICD-10-CM | POA: Diagnosis not present

## 2018-10-03 DIAGNOSIS — Z125 Encounter for screening for malignant neoplasm of prostate: Secondary | ICD-10-CM | POA: Diagnosis not present

## 2018-10-03 DIAGNOSIS — Z0000 Encounter for general adult medical examination without abnormal findings: Secondary | ICD-10-CM

## 2018-10-03 NOTE — Patient Instructions (Addendum)
.   Please review the attached list of medications and notify my office if there are any errors.   . Please bring all of your medications to every appointment so we can make sure that our medication list is the same as yours.   . You may want to consider new diabetes medications such as Ozempic or Trulicity that help people with diabetes lose weight.
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
## 2018-10-03 NOTE — Progress Notes (Signed)
Patient: Scott Simpson, Male    DOB: [birth year], 73 y.o.   MRN: [id number] Visit Date: 10/03/2018  Today's Provider: Lelon Huh, MD   Chief Complaint  Patient presents with   Annual Exam   Subjective:      Hypertension, follow-up:  BP Readings from Last 3 Encounters:  10/03/18 (!) 145/70  08/06/17 136/62  02/05/17 (!) 140/58    He was last seen for hypertension 1 years ago.  BP at that visit was 136/62. Management since that visit includes continuing same medication and work on losing weight. . He reports fair compliance with treatment. He is not having side effects.  He is not exercising. He is not adherent to low salt diet.   Outside blood pressures are not checked.  Patient denies chest pain, chest pressure/discomfort, dyspnea, exertional chest pressure/discomfort, irregular heart beat, orthopnea, palpitations, syncope and tachypnea.       Weight trend: stable Wt Readings from Last 3 Encounters:  10/03/18 (!) 378 lb (171.5 kg)  08/06/17 (!) 379 lb 6.4 oz (172.1 kg)  02/05/17 (!) 382 lb (173.3 kg)    Current diet: in general, an "unhealthy" diet   He continues to have diabetes managed by Dr. Nilda Simmer with last A1c = 6.2 on 04/24/2018. Had been on Jardiance but states he had some adverse effects from medication and has been off of it for several months.    -----------------------------------------------------------   Review of Systems  Constitutional: Negative.   HENT: Negative.   Eyes: Negative.   Respiratory: Positive for shortness of breath. Negative for apnea, cough, choking, chest tightness, wheezing and stridor.   Cardiovascular: Positive for leg swelling. Negative for chest pain and palpitations.  Gastrointestinal: Negative.   Endocrine: Negative.   Genitourinary: Positive for frequency. Negative for decreased urine volume, difficulty urinating, discharge, dysuria, enuresis, flank pain, genital sores, hematuria, penile pain, penile swelling,  scrotal swelling, testicular pain and urgency.  Musculoskeletal: Positive for arthralgias, back pain and gait problem. Negative for joint swelling, myalgias, neck pain and neck stiffness.  Skin: Negative.   Allergic/Immunologic: Negative.   Neurological: Positive for numbness. Negative for dizziness, tremors, seizures, syncope, facial asymmetry, speech difficulty, weakness, light-headedness and headaches.  Hematological: Negative.   Psychiatric/Behavioral: Negative.     Current Outpatient Medications:    amLODipine (NORVASC) 5 MG tablet, TAKE 1 TABLET BY MOUTH ONCE DAILY, Disp: 90 tablet, Rfl: 2   aspirin 81 MG tablet, Take 81 mg by mouth daily., Disp: , Rfl:    folic acid (FOLVITE) 1 MG tablet, TAKE 1 TABLET BY MOUTH ONCE DAILY, Disp: 90 tablet, Rfl: 4   furosemide (LASIX) 20 MG tablet, TAKE 1 TABLET BY MOUTH ONCE DAILY FOR EDEMA., Disp: 90 tablet, Rfl: 4   hydrALAZINE (APRESOLINE) 100 MG tablet, Take 100 mg by mouth 2 (two) times daily., Disp: , Rfl:    insulin regular human CONCENTRATED (HUMULIN R) 500 UNIT/ML injection, Inject 25 Units into the skin every morning. Inject 27 units at noom. Inject 29 units at night, Disp: , Rfl:    lisinopril (PRINIVIL,ZESTRIL) 20 MG tablet, TAKE 1 TABLET BY MOUTH ONCE DAILY, Disp: 270 tablet, Rfl: 3   lovastatin (MEVACOR) 40 MG tablet, TAKE 1 TABLET BY MOUTH EVERY DAY, Disp: 90 tablet, Rfl: 4   metFORMIN (GLUCOPHAGE) 1000 MG tablet, Take 1,000 mg by mouth 2 (two) times daily with a meal., Disp: , Rfl:    metoprolol succinate (TOPROL-XL) 100 MG 24 hr tablet, TAKE 1 TABLET BY MOUTH  EVERY DAY, Disp: 90 tablet, Rfl: 1   empagliflozin (JARDIANCE) 25 MG TABS tablet, Take 25 mg by mouth daily. , Disp: , Rfl:   Patient Care Team: Birdie Sons, MD as PCP - General (Family Medicine) Solum, Betsey Holiday, MD as Physician Assistant (Internal Medicine) Anell Barr, OD as Consulting Physician (Optometry) Samara Deist, DPM as Referring Physician  (Podiatry)     Objective:    Vitals: BP (!) 145/70 (BP Location: Right Arm, Patient Position: Sitting, Cuff Size: Large)    Pulse 98    Temp 98.7 F (37.1 C) (Oral)    Ht 6\' 5"  (1.956 m)    Wt (!) 378 lb (171.5 kg)    BMI 44.82 kg/m   Physical Exam   General Appearance:    Alert, cooperative, no distress, obese  Eyes:    PERRL, conjunctiva/corneas clear, EOM's intact       Lungs:     Clear to auscultation bilaterally, respirations unlabored  Heart:    Normal heart rate. Normal rhythm. No murmurs, rubs, or gallops.   Neurologic:   Awake, alert, oriented x 3. No apparent focal neurological           defect.   LE: 3+ bipedal edema.   Derm:   Extensive scaly lesions some flesh colored, some pigmented across back and chest and abdomen. Most of which are typical seborrheic keratoses.       Assessment & Plan:    1. Essential hypertension Not to goal. Consider adding thiazide.  - EKG 12-Lead - Comprehensive metabolic panel - TSH  2. Hyperlipidemia, unspecified hyperlipidemia type He is tolerating lovastatin well with no adverse effects.   - Comprehensive metabolic panel - Direct LDL - TSH - CBC  3. Morbid obesity ([place]) Encourage healthier eating habits. Discussed weight loss associated with GLP1 agonist. They are not sure if Dr. Nilda Simmer has prescribed this for him before, but will discuss with her at follow up.  - Comprehensive metabolic panel - TSH  4. Prostate cancer screening  - PSA  5. Skin lesions Most are consistent with s.k. but some atypical lesions.  - Ambulatory referral to Dermatology  6. Diabetes mellitus without complication ([place]) Well controlled.  Continue routine follow up with Dr. Gabriel Carina  7. Chronic diastolic heart failure (HCC) Continue ACE, furosemide. Consider adding thiazide after reviewing labs.     Lelon Huh, MD  [facility]

## 2018-10-04 LAB — COMPREHENSIVE METABOLIC PANEL
ALT: 18 IU/L (ref 0–44)
AST: 15 IU/L (ref 0–40)
Albumin/Globulin Ratio: 1.7 (ref 1.2–2.2)
Albumin: 3.8 g/dL (ref 3.7–4.7)
Alkaline Phosphatase: 61 IU/L (ref 39–117)
BUN/Creatinine Ratio: 28 — ABNORMAL HIGH (ref 10–24)
BUN: 26 mg/dL (ref 8–27)
Bilirubin Total: <0.2 mg/dL (ref 0.0–1.2)
CO2: 25 mmol/L (ref 20–29)
Calcium: 9.1 mg/dL (ref 8.6–10.2)
Chloride: 101 mmol/L (ref 96–106)
Creatinine, Ser: 0.92 mg/dL (ref 0.76–1.27)
GFR calc Af Amer: 96 mL/min/{1.73_m2} (ref >59)
GFR calc non Af Amer: 83 mL/min/{1.73_m2} (ref >59)
Globulin, Total: 2.3 g/dL (ref 1.5–4.5)
Glucose: 67 mg/dL (ref 65–99)
Potassium: 5 mmol/L (ref 3.5–5.2)
Sodium: 144 mmol/L (ref 134–144)
Total Protein: 6.1 g/dL (ref 6.0–8.5)

## 2018-10-04 LAB — CBC
Hematocrit: 42.1 % (ref 37.5–51.0)
Hemoglobin: 13.6 g/dL (ref 13.0–17.7)
MCH: 28.2 pg (ref 26.6–33.0)
MCHC: 32.3 g/dL (ref 31.5–35.7)
MCV: 87 fL (ref 79–97)
Platelets: 230 10*3/uL (ref 150–450)
RBC: 4.82 x10E6/uL (ref 4.14–5.80)
RDW: 14.5 % (ref 11.6–15.4)
WBC: 9 10*3/uL (ref 3.4–10.8)

## 2018-10-04 LAB — PSA: Prostate Specific Ag, Serum: 0.1 ng/mL (ref 0.0–4.0)

## 2018-10-04 LAB — TSH: TSH: 1.43 u[IU]/mL (ref 0.450–4.500)

## 2018-10-04 LAB — LDL CHOLESTEROL, DIRECT: LDL Direct: 53 mg/dL (ref 0–99)

## 2018-10-06 ENCOUNTER — Telehealth: Payer: Self-pay

## 2018-10-06 MED ORDER — LISINOPRIL-HYDROCHLOROTHIAZIDE 20-12.5 MG PO TABS: 1.0000 | ORAL_TABLET | Freq: Every day | ORAL | 2 refills | Status: DC

## 2018-10-06 NOTE — Telephone Encounter (Signed)
Pt's wife advised.  (on DPR)  RX sent to [facility] in [place].

## 2018-10-06 NOTE — Telephone Encounter (Signed)
-----   Message from Birdie Sons, MD sent at 10/06/2018  7:52 AM EDT ----- ALSO, need to change lisinopril to lisinopril/hctz 20/12.5mg  once daily for better control of blood pressure, #90, rf x 2.

## 2018-10-13 ENCOUNTER — Other Ambulatory Visit: Payer: Self-pay | Admitting: Family Medicine

## 2018-10-24 DIAGNOSIS — E1169 Type 2 diabetes mellitus with other specified complication: Secondary | ICD-10-CM | POA: Diagnosis not present

## 2018-10-24 DIAGNOSIS — E669 Obesity, unspecified: Secondary | ICD-10-CM | POA: Diagnosis not present

## 2018-10-29 DIAGNOSIS — L83 Acanthosis nigricans: Secondary | ICD-10-CM | POA: Diagnosis not present

## 2018-10-29 DIAGNOSIS — L821 Other seborrheic keratosis: Secondary | ICD-10-CM | POA: Diagnosis not present

## 2018-10-29 DIAGNOSIS — L859 Epidermal thickening, unspecified: Secondary | ICD-10-CM | POA: Diagnosis not present

## 2018-10-31 DIAGNOSIS — E1129 Type 2 diabetes mellitus with other diabetic kidney complication: Secondary | ICD-10-CM | POA: Diagnosis not present

## 2018-10-31 DIAGNOSIS — R809 Proteinuria, unspecified: Secondary | ICD-10-CM | POA: Diagnosis not present

## 2018-10-31 DIAGNOSIS — E669 Obesity, unspecified: Secondary | ICD-10-CM | POA: Diagnosis not present

## 2018-10-31 DIAGNOSIS — E1169 Type 2 diabetes mellitus with other specified complication: Secondary | ICD-10-CM | POA: Diagnosis not present

## 2018-10-31 DIAGNOSIS — Z794 Long term (current) use of insulin: Secondary | ICD-10-CM | POA: Diagnosis not present

## 2018-12-15 DIAGNOSIS — B351 Tinea unguium: Secondary | ICD-10-CM | POA: Diagnosis not present

## 2018-12-15 DIAGNOSIS — Z794 Long term (current) use of insulin: Secondary | ICD-10-CM | POA: Diagnosis not present

## 2018-12-15 DIAGNOSIS — E1142 Type 2 diabetes mellitus with diabetic polyneuropathy: Secondary | ICD-10-CM | POA: Diagnosis not present

## 2019-01-04 ENCOUNTER — Other Ambulatory Visit: Payer: Self-pay | Admitting: Family Medicine

## 2019-01-04 MED ORDER — AMLODIPINE BESYLATE 10 MG PO TABS: 10.0000 mg | ORAL_TABLET | Freq: Every day | ORAL | 4 refills | Status: DC

## 2019-01-22 ENCOUNTER — Other Ambulatory Visit: Payer: Self-pay

## 2019-04-08 ENCOUNTER — Other Ambulatory Visit: Payer: Self-pay | Admitting: Family Medicine

## 2019-04-12 ENCOUNTER — Emergency Department: Payer: Medicare Other

## 2019-04-12 ENCOUNTER — Inpatient Hospital Stay
Admission: EM | Admit: 2019-04-12 | Discharge: 2019-04-22 | DRG: 871 | Disposition: A | Discharge status: Skilled Nursing Facility | Payer: Medicare Other | Attending: Internal Medicine | Admitting: Internal Medicine

## 2019-04-12 ENCOUNTER — Encounter: Payer: Self-pay | Admitting: Emergency Medicine

## 2019-04-12 DIAGNOSIS — E785 Hyperlipidemia, unspecified: Secondary | ICD-10-CM | POA: Diagnosis present

## 2019-04-12 DIAGNOSIS — Z6841 Body Mass Index (BMI) 40.0 and over, adult: Secondary | ICD-10-CM

## 2019-04-12 DIAGNOSIS — N289 Disorder of kidney and ureter, unspecified: Secondary | ICD-10-CM | POA: Diagnosis not present

## 2019-04-12 DIAGNOSIS — I5031 Acute diastolic (congestive) heart failure: Secondary | ICD-10-CM | POA: Diagnosis not present

## 2019-04-12 DIAGNOSIS — Z87820 Personal history of traumatic brain injury: Secondary | ICD-10-CM | POA: Diagnosis not present

## 2019-04-12 DIAGNOSIS — Z8619 Personal history of other infectious and parasitic diseases: Secondary | ICD-10-CM | POA: Diagnosis not present

## 2019-04-12 DIAGNOSIS — I110 Hypertensive heart disease with heart failure: Secondary | ICD-10-CM | POA: Diagnosis present

## 2019-04-12 DIAGNOSIS — I5033 Acute on chronic diastolic (congestive) heart failure: Secondary | ICD-10-CM | POA: Diagnosis present

## 2019-04-12 DIAGNOSIS — E569 Vitamin deficiency, unspecified: Secondary | ICD-10-CM | POA: Diagnosis not present

## 2019-04-12 DIAGNOSIS — R0902 Hypoxemia: Secondary | ICD-10-CM | POA: Diagnosis not present

## 2019-04-12 DIAGNOSIS — Z8249 Family history of ischemic heart disease and other diseases of the circulatory system: Secondary | ICD-10-CM

## 2019-04-12 DIAGNOSIS — S069X0S Unspecified intracranial injury without loss of consciousness, sequela: Secondary | ICD-10-CM | POA: Diagnosis not present

## 2019-04-12 DIAGNOSIS — E118 Type 2 diabetes mellitus with unspecified complications: Secondary | ICD-10-CM

## 2019-04-12 DIAGNOSIS — R413 Other amnesia: Secondary | ICD-10-CM | POA: Diagnosis present

## 2019-04-12 DIAGNOSIS — Z20822 Contact with and (suspected) exposure to covid-19: Secondary | ICD-10-CM | POA: Diagnosis present

## 2019-04-12 DIAGNOSIS — R778 Other specified abnormalities of plasma proteins: Secondary | ICD-10-CM | POA: Diagnosis not present

## 2019-04-12 DIAGNOSIS — H04123 Dry eye syndrome of bilateral lacrimal glands: Secondary | ICD-10-CM | POA: Diagnosis not present

## 2019-04-12 DIAGNOSIS — A09 Infectious gastroenteritis and colitis, unspecified: Secondary | ICD-10-CM

## 2019-04-12 DIAGNOSIS — N281 Cyst of kidney, acquired: Secondary | ICD-10-CM | POA: Diagnosis present

## 2019-04-12 DIAGNOSIS — R590 Localized enlarged lymph nodes: Secondary | ICD-10-CM

## 2019-04-12 DIAGNOSIS — J9811 Atelectasis: Secondary | ICD-10-CM | POA: Diagnosis present

## 2019-04-12 DIAGNOSIS — R488 Other symbolic dysfunctions: Secondary | ICD-10-CM | POA: Diagnosis not present

## 2019-04-12 DIAGNOSIS — I5032 Chronic diastolic (congestive) heart failure: Secondary | ICD-10-CM | POA: Diagnosis not present

## 2019-04-12 DIAGNOSIS — A419 Sepsis, unspecified organism: Principal | ICD-10-CM | POA: Diagnosis present

## 2019-04-12 DIAGNOSIS — C3490 Malignant neoplasm of unspecified part of unspecified bronchus or lung: Secondary | ICD-10-CM

## 2019-04-12 DIAGNOSIS — Z794 Long term (current) use of insulin: Secondary | ICD-10-CM

## 2019-04-12 DIAGNOSIS — C3491 Malignant neoplasm of unspecified part of right bronchus or lung: Secondary | ICD-10-CM | POA: Diagnosis present

## 2019-04-12 DIAGNOSIS — E1151 Type 2 diabetes mellitus with diabetic peripheral angiopathy without gangrene: Secondary | ICD-10-CM | POA: Diagnosis present

## 2019-04-12 DIAGNOSIS — R4182 Altered mental status, unspecified: Secondary | ICD-10-CM | POA: Diagnosis not present

## 2019-04-12 DIAGNOSIS — Z86010 Personal history of colonic polyps: Secondary | ICD-10-CM

## 2019-04-12 DIAGNOSIS — I248 Other forms of acute ischemic heart disease: Secondary | ICD-10-CM | POA: Diagnosis present

## 2019-04-12 DIAGNOSIS — N179 Acute kidney failure, unspecified: Secondary | ICD-10-CM | POA: Diagnosis present

## 2019-04-12 DIAGNOSIS — Z808 Family history of malignant neoplasm of other organs or systems: Secondary | ICD-10-CM

## 2019-04-12 DIAGNOSIS — I10 Essential (primary) hypertension: Secondary | ICD-10-CM | POA: Diagnosis present

## 2019-04-12 DIAGNOSIS — J9601 Acute respiratory failure with hypoxia: Secondary | ICD-10-CM | POA: Diagnosis present

## 2019-04-12 DIAGNOSIS — J189 Pneumonia, unspecified organism: Secondary | ICD-10-CM | POA: Diagnosis present

## 2019-04-12 DIAGNOSIS — Z8042 Family history of malignant neoplasm of prostate: Secondary | ICD-10-CM

## 2019-04-12 DIAGNOSIS — K5909 Other constipation: Secondary | ICD-10-CM | POA: Diagnosis not present

## 2019-04-12 DIAGNOSIS — Z8719 Personal history of other diseases of the digestive system: Secondary | ICD-10-CM

## 2019-04-12 DIAGNOSIS — M2550 Pain in unspecified joint: Secondary | ICD-10-CM | POA: Diagnosis not present

## 2019-04-12 DIAGNOSIS — J181 Lobar pneumonia, unspecified organism: Secondary | ICD-10-CM | POA: Diagnosis not present

## 2019-04-12 DIAGNOSIS — H9190 Unspecified hearing loss, unspecified ear: Secondary | ICD-10-CM | POA: Diagnosis not present

## 2019-04-12 DIAGNOSIS — R410 Disorientation, unspecified: Secondary | ICD-10-CM | POA: Diagnosis not present

## 2019-04-12 DIAGNOSIS — E279 Disorder of adrenal gland, unspecified: Secondary | ICD-10-CM | POA: Diagnosis not present

## 2019-04-12 DIAGNOSIS — R6521 Severe sepsis with septic shock: Secondary | ICD-10-CM | POA: Diagnosis present

## 2019-04-12 DIAGNOSIS — R404 Transient alteration of awareness: Secondary | ICD-10-CM | POA: Diagnosis not present

## 2019-04-12 DIAGNOSIS — R2681 Unsteadiness on feet: Secondary | ICD-10-CM | POA: Diagnosis not present

## 2019-04-12 DIAGNOSIS — R0602 Shortness of breath: Secondary | ICD-10-CM | POA: Diagnosis not present

## 2019-04-12 DIAGNOSIS — Z79899 Other long term (current) drug therapy: Secondary | ICD-10-CM

## 2019-04-12 DIAGNOSIS — E1165 Type 2 diabetes mellitus with hyperglycemia: Secondary | ICD-10-CM | POA: Diagnosis not present

## 2019-04-12 DIAGNOSIS — M6281 Muscle weakness (generalized): Secondary | ICD-10-CM | POA: Diagnosis not present

## 2019-04-12 DIAGNOSIS — N2889 Other specified disorders of kidney and ureter: Secondary | ICD-10-CM | POA: Diagnosis not present

## 2019-04-12 DIAGNOSIS — Z7401 Bed confinement status: Secondary | ICD-10-CM | POA: Diagnosis not present

## 2019-04-12 DIAGNOSIS — Z515 Encounter for palliative care: Secondary | ICD-10-CM | POA: Diagnosis present

## 2019-04-12 DIAGNOSIS — Z87891 Personal history of nicotine dependence: Secondary | ICD-10-CM

## 2019-04-12 DIAGNOSIS — R498 Other voice and resonance disorders: Secondary | ICD-10-CM | POA: Diagnosis not present

## 2019-04-12 DIAGNOSIS — E278 Other specified disorders of adrenal gland: Secondary | ICD-10-CM

## 2019-04-12 DIAGNOSIS — R6520 Severe sepsis without septic shock: Secondary | ICD-10-CM | POA: Diagnosis not present

## 2019-04-12 DIAGNOSIS — J984 Other disorders of lung: Secondary | ICD-10-CM

## 2019-04-12 LAB — URINALYSIS, COMPLETE (UACMP) WITH MICROSCOPIC
Bacteria, UA: NONE SEEN
Bilirubin Urine: NEGATIVE
Glucose, UA: NEGATIVE mg/dL
Ketones, ur: NEGATIVE mg/dL
Leukocytes,Ua: NEGATIVE
Nitrite: NEGATIVE
Protein, ur: 100 mg/dL — AB
Specific Gravity, Urine: >1.046 — ABNORMAL HIGH (ref 1.005–1.030)
pH: 5 (ref 5.0–8.0)

## 2019-04-12 LAB — GLUCOSE, CAPILLARY: Glucose-Capillary: 172 mg/dL — ABNORMAL HIGH (ref 70–99)

## 2019-04-12 LAB — FIBRIN DERIVATIVES D-DIMER (ARMC ONLY): Fibrin derivatives D-dimer (ARMC): 1563.81 ng/mL (FEU) — ABNORMAL HIGH (ref 0.00–499.00)

## 2019-04-12 LAB — CBC WITH DIFFERENTIAL/PLATELET
Abs Immature Granulocytes: 0.03 10*3/uL (ref 0.00–0.07)
Basophils Absolute: 0 10*3/uL (ref 0.0–0.1)
Basophils Relative: 0 %
Eosinophils Absolute: 0 10*3/uL (ref 0.0–0.5)
Eosinophils Relative: 0 %
HCT: 43.9 % (ref 39.0–52.0)
Hemoglobin: 13 g/dL (ref 13.0–17.0)
Immature Granulocytes: 0 %
Lymphocytes Relative: 4 %
Lymphs Abs: 0.4 10*3/uL — ABNORMAL LOW (ref 0.7–4.0)
MCH: 28.6 pg (ref 26.0–34.0)
MCHC: 29.6 g/dL — ABNORMAL LOW (ref 30.0–36.0)
MCV: 96.5 fL (ref 80.0–100.0)
Monocytes Absolute: 0.4 10*3/uL (ref 0.1–1.0)
Monocytes Relative: 4 %
Neutro Abs: 8.2 10*3/uL — ABNORMAL HIGH (ref 1.7–7.7)
Neutrophils Relative %: 92 %
Platelets: 218 10*3/uL (ref 150–400)
RBC: 4.55 MIL/uL (ref 4.22–5.81)
RDW: 16.6 % — ABNORMAL HIGH (ref 11.5–15.5)
WBC: 9.1 10*3/uL (ref 4.0–10.5)
nRBC: 0 % (ref 0.0–0.2)

## 2019-04-12 LAB — LACTATE DEHYDROGENASE: LDH: 133 U/L (ref 98–192)

## 2019-04-12 LAB — FERRITIN: Ferritin: 20 ng/mL — ABNORMAL LOW (ref 24–336)

## 2019-04-12 LAB — COMPREHENSIVE METABOLIC PANEL
ALT: 18 U/L (ref 0–44)
AST: 28 U/L (ref 15–41)
Albumin: 3.1 g/dL — ABNORMAL LOW (ref 3.5–5.0)
Alkaline Phosphatase: 39 U/L (ref 38–126)
Anion gap: 14 (ref 5–15)
BUN: 39 mg/dL — ABNORMAL HIGH (ref 8–23)
CO2: 24 mmol/L (ref 22–32)
Calcium: 8 mg/dL — ABNORMAL LOW (ref 8.9–10.3)
Chloride: 102 mmol/L (ref 98–111)
Creatinine, Ser: 1.41 mg/dL — ABNORMAL HIGH (ref 0.61–1.24)
GFR calc Af Amer: 58 mL/min — ABNORMAL LOW (ref >60)
GFR calc non Af Amer: 50 mL/min — ABNORMAL LOW (ref >60)
Glucose, Bld: 193 mg/dL — ABNORMAL HIGH (ref 70–99)
Potassium: 4.5 mmol/L (ref 3.5–5.1)
Sodium: 140 mmol/L (ref 135–145)
Total Bilirubin: 0.6 mg/dL (ref 0.3–1.2)
Total Protein: 6.3 g/dL — ABNORMAL LOW (ref 6.5–8.1)

## 2019-04-12 LAB — RESPIRATORY PANEL BY RT PCR (FLU A&B, COVID)
Influenza A by PCR: NEGATIVE
Influenza B by PCR: NEGATIVE
SARS Coronavirus 2 by RT PCR: NEGATIVE

## 2019-04-12 LAB — LACTIC ACID, PLASMA
Lactic Acid, Venous: 6.3 mmol/L (ref 0.5–1.9)
Lactic Acid, Venous: 2.2 mmol/L (ref 0.5–1.9)

## 2019-04-12 LAB — PROCALCITONIN: Procalcitonin: 0.59 ng/mL

## 2019-04-12 LAB — POC SARS CORONAVIRUS 2 AG: SARS Coronavirus 2 Ag: NEGATIVE

## 2019-04-12 LAB — FIBRINOGEN: Fibrinogen: 555 mg/dL — ABNORMAL HIGH (ref 210–475)

## 2019-04-12 LAB — TROPONIN I (HIGH SENSITIVITY)
Troponin I (High Sensitivity): 47 ng/L — ABNORMAL HIGH (ref <18)
Troponin I (High Sensitivity): 382 ng/L (ref <18)

## 2019-04-12 LAB — TRIGLYCERIDES: Triglycerides: 119 mg/dL (ref <150)

## 2019-04-12 LAB — BRAIN NATRIURETIC PEPTIDE: B Natriuretic Peptide: 43 pg/mL (ref 0.0–100.0)

## 2019-04-12 MED ORDER — DEXAMETHASONE SODIUM PHOSPHATE 10 MG/ML IJ SOLN
8.0000 mg | Freq: Once | INTRAMUSCULAR | Status: AC
  Administered 2019-04-12: 8 mg via INTRAVENOUS
  Filled 2019-04-12: qty 1

## 2019-04-12 MED ORDER — IOHEXOL 350 MG/ML SOLN
100.0000 mL | Freq: Once | INTRAVENOUS | Status: AC | PRN
  Administered 2019-04-12: 100 mL via INTRAVENOUS

## 2019-04-12 MED ORDER — LACTATED RINGERS IV BOLUS (SEPSIS): 1000.0000 mL | Freq: Once | INTRAVENOUS | Status: DC

## 2019-04-12 MED ORDER — INSULIN ASPART 100 UNIT/ML ~~LOC~~ SOLN
0.0000 [IU] | Freq: Three times a day (TID) | SUBCUTANEOUS | Status: DC
  Administered 2019-04-13: 3 [IU] via SUBCUTANEOUS
  Administered 2019-04-13: 4 [IU] via SUBCUTANEOUS
  Administered 2019-04-13 – 2019-04-14 (×2): 3 [IU] via SUBCUTANEOUS
  Administered 2019-04-14: 7 [IU] via SUBCUTANEOUS
  Administered 2019-04-14 – 2019-04-16 (×5): 3 [IU] via SUBCUTANEOUS
  Administered 2019-04-16 – 2019-04-17 (×2): 4 [IU] via SUBCUTANEOUS
  Administered 2019-04-17: 3 [IU] via SUBCUTANEOUS
  Administered 2019-04-17: 7 [IU] via SUBCUTANEOUS
  Administered 2019-04-18: 3 [IU] via SUBCUTANEOUS
  Administered 2019-04-18 (×2): 4 [IU] via SUBCUTANEOUS
  Administered 2019-04-19: 3 [IU] via SUBCUTANEOUS
  Administered 2019-04-19: 4 [IU] via SUBCUTANEOUS
  Administered 2019-04-19: 7 [IU] via SUBCUTANEOUS
  Administered 2019-04-20: 4 [IU] via SUBCUTANEOUS
  Administered 2019-04-20: 11 [IU] via SUBCUTANEOUS
  Administered 2019-04-20: 7 [IU] via SUBCUTANEOUS
  Administered 2019-04-21: 4 [IU] via SUBCUTANEOUS
  Administered 2019-04-21: 7 [IU] via SUBCUTANEOUS
  Administered 2019-04-21: 11 [IU] via SUBCUTANEOUS
  Administered 2019-04-22: 4 [IU] via SUBCUTANEOUS
  Administered 2019-04-22: 7 [IU] via SUBCUTANEOUS
  Filled 2019-04-12 (×28): qty 1

## 2019-04-12 MED ORDER — INSULIN ASPART 100 UNIT/ML ~~LOC~~ SOLN: 0.0000 [IU] | SUBCUTANEOUS | Status: DC

## 2019-04-12 MED ORDER — SODIUM CHLORIDE 0.9 % IV SOLN
500.0000 mg | INTRAVENOUS | Status: DC
  Administered 2019-04-12: 500 mg via INTRAVENOUS
  Filled 2019-04-12 (×2): qty 500

## 2019-04-12 MED ORDER — ACETAMINOPHEN 325 MG PO TABS: 650.0000 mg | ORAL_TABLET | Freq: Four times a day (QID) | ORAL | Status: DC | PRN

## 2019-04-12 MED ORDER — SODIUM CHLORIDE 0.9 % IV BOLUS
1000.0000 mL | Freq: Once | INTRAVENOUS | Status: AC
  Administered 2019-04-12: 1000 mL via INTRAVENOUS

## 2019-04-12 MED ORDER — LACTATED RINGERS IV BOLUS (SEPSIS): 500.0000 mL | Freq: Once | INTRAVENOUS | Status: DC

## 2019-04-12 MED ORDER — SODIUM CHLORIDE 0.9 % IV SOLN
2.0000 g | Freq: Two times a day (BID) | INTRAVENOUS | Status: DC
  Administered 2019-04-12 – 2019-04-13 (×2): 2 g via INTRAVENOUS
  Filled 2019-04-12 (×3): qty 2

## 2019-04-12 MED ORDER — SODIUM CHLORIDE 0.9 % IV SOLN
2.0000 g | INTRAVENOUS | Status: DC
  Administered 2019-04-12: 2 g via INTRAVENOUS
  Filled 2019-04-12: qty 20

## 2019-04-12 MED ORDER — ENOXAPARIN SODIUM 40 MG/0.4ML ~~LOC~~ SOLN
40.0000 mg | SUBCUTANEOUS | Status: DC
  Administered 2019-04-12: 40 mg via SUBCUTANEOUS
  Filled 2019-04-12: qty 0.4

## 2019-04-12 MED ORDER — ACETAMINOPHEN 650 MG RE SUPP: 650.0000 mg | Freq: Four times a day (QID) | RECTAL | Status: DC | PRN

## 2019-04-12 MED ORDER — INSULIN ASPART 100 UNIT/ML ~~LOC~~ SOLN
0.0000 [IU] | Freq: Every day | SUBCUTANEOUS | Status: DC
  Administered 2019-04-19 – 2019-04-20 (×2): 2 [IU] via SUBCUTANEOUS
  Administered 2019-04-21: 3 [IU] via SUBCUTANEOUS
  Filled 2019-04-12 (×3): qty 1

## 2019-04-12 MED ORDER — METRONIDAZOLE IN NACL 5-0.79 MG/ML-% IV SOLN
500.0000 mg | Freq: Three times a day (TID) | INTRAVENOUS | Status: DC
  Administered 2019-04-12 – 2019-04-13 (×2): 500 mg via INTRAVENOUS
  Filled 2019-04-12 (×4): qty 100

## 2019-04-12 MED ORDER — LACTATED RINGERS IV SOLN: INTRAVENOUS | Status: DC

## 2019-04-12 MED ORDER — SODIUM CHLORIDE 0.9 % IV SOLN
INTRAVENOUS | Status: DC | PRN
  Administered 2019-04-12 – 2019-04-15 (×5): 250 mL via INTRAVENOUS
  Administered 2019-04-17: 500 mL via INTRAVENOUS

## 2019-04-12 MED ORDER — SODIUM CHLORIDE 0.9 % IV SOLN
500.0000 mg | INTRAVENOUS | Status: DC
  Filled 2019-04-12: qty 500

## 2019-04-12 MED ORDER — ONDANSETRON HCL 4 MG PO TABS: 4.0000 mg | ORAL_TABLET | Freq: Four times a day (QID) | ORAL | Status: DC | PRN

## 2019-04-12 MED ORDER — ONDANSETRON HCL 4 MG/2ML IJ SOLN: 4.0000 mg | Freq: Four times a day (QID) | INTRAMUSCULAR | Status: DC | PRN

## 2019-04-12 MED ORDER — SODIUM CHLORIDE 0.9% FLUSH
10.0000 mL | Freq: Two times a day (BID) | INTRAVENOUS | Status: DC
  Administered 2019-04-12 – 2019-04-22 (×18): 10 mL via INTRAVENOUS

## 2019-04-12 NOTE — ED Notes (Signed)
Date and time results received: 04/12/19 1515 (use smartphrase ".now" to insert current time)  Test: Lactic Critical Value: 6.3  Name of Provider Notified: Dr Cherylann Banas  Orders Received? Or Actions Taken?: See Mar

## 2019-04-12 NOTE — ED Notes (Signed)
This tech remains at bedside as 1:1 Air cabin crew; pt watching tv at this time. No concerns voiced. Will remain at bedside.

## 2019-04-12 NOTE — ED Notes (Signed)
Pt sleeping at this time with this tech at beside as 1:1 Air cabin crew.

## 2019-04-12 NOTE — ED Triage Notes (Signed)
Pt to ED by EMS from home where family state pt has had diarrhea and fever. Pt also with c/o of SOB. Upon EMS arrival pt sat levels in the low to mid 70s, Pt placed on 2 L by EMS. Upon ED arrival pt sat 84% on 2L. Pt placed on non rebreather.
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
## 2019-04-12 NOTE — ED Notes (Signed)
This tech still at bedside as 1:1 Air cabin crew. Pt used urinal with no assistance; pt given water as well. No other concerns voiced at this time. Will remain at bedside.

## 2019-04-12 NOTE — ED Notes (Signed)
This tech as 1:1 Air cabin crew at this time per Wells Guiles, Therapist, sports. Pt in bed watching tv with no concerns voiced at this time. Will remain at bedside.

## 2019-04-12 NOTE — ED Provider Notes (Signed)
Patient was seen by me, Covid is negative.  There are some concerning findings on his CT chest which are worrisome for cancer.  He also may have pneumonia.  Lactate seems to be improving.  He is hypoxic on oxygen.  I will discuss with the hospitalist for admission.   Earleen Newport, MD 04/12/19 [phone]
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
## 2019-04-12 NOTE — Progress Notes (Signed)
Pharmacy Antibiotic Note  ALA CAPRI is a 74 y.o. male admitted on 04/12/2019 with pneumonia, sepsis and intra-abdominal infection.  Pharmacy has been consulted for Cefepime dosing. Patient is also ordered Azithromycin and Metronidazole.  Plan: Cefepime 2g IV q12h   Height: 6\' 2"  (188 cm) IBW/kg (Calculated) : 82.2  Temp (24hrs), Avg:98.9 F (37.2 C), Min:98.9 F (37.2 C), Max:98.9 F (37.2 C)  Recent Labs  Lab 04/12/19 1425 04/12/19 1626  WBC 9.1  --   CREATININE 1.41*  --   LATICACIDVEN 6.3* 2.2*    CrCl cannot be calculated (Unknown ideal weight.).    No Known Allergies  Antimicrobials this admission: Ceftriaxone  2/7 x 1 in the ED Azithromycin 2/7 >>  Metronidazole 2/7 >> Cefepime 2/7 >>  Dose adjustments this admission:  Microbiology results:  Thank you for allowing pharmacy to be a part of this patient's care.  Paulina Fusi, PharmD, BCPS 04/12/2019 8:54 PM

## 2019-04-13 ENCOUNTER — Inpatient Hospital Stay: Payer: Medicare Other

## 2019-04-13 ENCOUNTER — Inpatient Hospital Stay (HOSPITAL_COMMUNITY)
Admit: 2019-04-13 | Discharge: 2019-04-13 | Disposition: A | Discharge status: Home / Self Care | Payer: Medicare Other | Attending: Internal Medicine | Admitting: Internal Medicine

## 2019-04-13 ENCOUNTER — Encounter: Payer: Self-pay | Admitting: Internal Medicine

## 2019-04-13 ENCOUNTER — Other Ambulatory Visit: Payer: Self-pay | Admitting: Internal Medicine

## 2019-04-13 DIAGNOSIS — R590 Localized enlarged lymph nodes: Secondary | ICD-10-CM

## 2019-04-13 DIAGNOSIS — I5031 Acute diastolic (congestive) heart failure: Secondary | ICD-10-CM

## 2019-04-13 DIAGNOSIS — N2889 Other specified disorders of kidney and ureter: Secondary | ICD-10-CM

## 2019-04-13 DIAGNOSIS — J181 Lobar pneumonia, unspecified organism: Secondary | ICD-10-CM

## 2019-04-13 DIAGNOSIS — R6521 Severe sepsis with septic shock: Secondary | ICD-10-CM

## 2019-04-13 LAB — C-REACTIVE PROTEIN: CRP: 9.1 mg/dL — ABNORMAL HIGH (ref <1.0)

## 2019-04-13 LAB — COMPREHENSIVE METABOLIC PANEL
ALT: 27 U/L (ref 0–44)
AST: 60 U/L — ABNORMAL HIGH (ref 15–41)
Albumin: 3 g/dL — ABNORMAL LOW (ref 3.5–5.0)
Alkaline Phosphatase: 42 U/L (ref 38–126)
Anion gap: 7 (ref 5–15)
BUN: 47 mg/dL — ABNORMAL HIGH (ref 8–23)
CO2: 29 mmol/L (ref 22–32)
Calcium: 7.7 mg/dL — ABNORMAL LOW (ref 8.9–10.3)
Chloride: 102 mmol/L (ref 98–111)
Creatinine, Ser: 0.96 mg/dL (ref 0.61–1.24)
GFR calc Af Amer: >60 mL/min (ref >60)
GFR calc non Af Amer: >60 mL/min (ref >60)
Glucose, Bld: 142 mg/dL — ABNORMAL HIGH (ref 70–99)
Potassium: 4.1 mmol/L (ref 3.5–5.1)
Sodium: 138 mmol/L (ref 135–145)
Total Bilirubin: 0.4 mg/dL (ref 0.3–1.2)
Total Protein: 6.3 g/dL — ABNORMAL LOW (ref 6.5–8.1)

## 2019-04-13 LAB — GLUCOSE, CAPILLARY
Glucose-Capillary: 137 mg/dL — ABNORMAL HIGH (ref 70–99)
Glucose-Capillary: 172 mg/dL — ABNORMAL HIGH (ref 70–99)
Glucose-Capillary: 141 mg/dL — ABNORMAL HIGH (ref 70–99)
Glucose-Capillary: 166 mg/dL — ABNORMAL HIGH (ref 70–99)

## 2019-04-13 LAB — PROCALCITONIN: Procalcitonin: 0.63 ng/mL

## 2019-04-13 LAB — LACTIC ACID, PLASMA: Lactic Acid, Venous: 0.8 mmol/L (ref 0.5–1.9)

## 2019-04-13 LAB — CBC
HCT: 40.3 % (ref 39.0–52.0)
Hemoglobin: 12.1 g/dL — ABNORMAL LOW (ref 13.0–17.0)
MCH: 28.7 pg (ref 26.0–34.0)
MCHC: 30 g/dL (ref 30.0–36.0)
MCV: 95.7 fL (ref 80.0–100.0)
Platelets: 191 10*3/uL (ref 150–400)
RBC: 4.21 MIL/uL — ABNORMAL LOW (ref 4.22–5.81)
RDW: 16.5 % — ABNORMAL HIGH (ref 11.5–15.5)
WBC: 6.7 10*3/uL (ref 4.0–10.5)
nRBC: 0 % (ref 0.0–0.2)

## 2019-04-13 LAB — PROTIME-INR
INR: 1.1 (ref 0.8–1.2)
Prothrombin Time: 13.7 seconds (ref 11.4–15.2)

## 2019-04-13 LAB — CORTISOL-AM, BLOOD: Cortisol - AM: 4.5 ug/dL — ABNORMAL LOW (ref 6.7–22.6)

## 2019-04-13 LAB — LACTATE DEHYDROGENASE: LDH: 222 U/L — ABNORMAL HIGH (ref 98–192)

## 2019-04-13 MED ORDER — IOHEXOL 300 MG/ML  SOLN
125.0000 mL | Freq: Once | INTRAMUSCULAR | Status: AC | PRN
  Administered 2019-04-13: 125 mL via INTRAVENOUS

## 2019-04-13 MED ORDER — ENOXAPARIN SODIUM 40 MG/0.4ML ~~LOC~~ SOLN
40.0000 mg | Freq: Two times a day (BID) | SUBCUTANEOUS | Status: DC
  Administered 2019-04-13 – 2019-04-14 (×2): 40 mg via SUBCUTANEOUS
  Filled 2019-04-13 (×2): qty 0.4

## 2019-04-13 MED ORDER — SODIUM CHLORIDE 0.9 % IV SOLN
2.0000 g | Freq: Three times a day (TID) | INTRAVENOUS | Status: DC
  Administered 2019-04-13 – 2019-04-18 (×14): 2 g via INTRAVENOUS
  Filled 2019-04-13 (×17): qty 2

## 2019-04-13 MED ORDER — FUROSEMIDE 10 MG/ML IJ SOLN
40.0000 mg | Freq: Once | INTRAMUSCULAR | Status: AC
  Administered 2019-04-13: 40 mg via INTRAVENOUS
  Filled 2019-04-13: qty 4

## 2019-04-13 NOTE — Progress Notes (Signed)
Spoke to patient's Daughter-  (patient gave ok).  Gave update on Dad's condition.   Let her know was are diuresing now to get some of the fluid off while monitoring his BP.    Still on 5 L O2 at this time.
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
## 2019-04-13 NOTE — Progress Notes (Signed)
Pharmacy Antibiotic Note  Scott Simpson is a 74 y.o. male admitted on 04/12/2019 with pneumonia, sepsis and intra-abdominal infection.  Pharmacy has been consulted for Cefepime dosing.   Plan: Cefepime 2g IV q8h - improvement in CrCl.   Height: 6\' 2"  (188 cm) Weight: (!) 380 lb 8 oz (172.6 kg) IBW/kg (Calculated) : 82.2  Temp (24hrs), Avg:98.3 F (36.8 C), Min:97.6 F (36.4 C), Max:98.9 F (37.2 C)  Recent Labs  Lab 04/12/19 1425 04/12/19 1626 04/13/19 0621  WBC 9.1  --  6.7  CREATININE 1.41*  --  0.96  LATICACIDVEN 6.3* 2.2* 0.8    Estimated Creatinine Clearance: 118.2 mL/min (by C-G formula based on SCr of 0.96 mg/dL).    No Known Allergies  Antimicrobials this admission: Ceftriaxone  2/7 x 1 in the ED Azithromycin 2/7 >> 2/8 Metronidazole 2/7 >>2/8 Cefepime 2/7 >>    Microbiology results: 2/7 BCx pending  Thank you for allowing pharmacy to be a part of this patient's care.  Eleonore Chiquito, PharmD, BCPS 04/13/2019 12:40 PM

## 2019-04-13 NOTE — Assessment & Plan Note (Deleted)
#   mediastinal--

## 2019-04-13 NOTE — Progress Notes (Signed)
*  PRELIMINARY RESULTS* Echocardiogram 2D Echocardiogram has been performed.  Jonette Mate Ngozi Alvidrez 04/13/2019, 7:54 PM

## 2019-04-13 NOTE — Plan of Care (Signed)
  Problem: Education: Goal: Knowledge of General Education information will improve Description: Including pain rating scale, medication(s)/side effects and non-pharmacologic comfort measures Outcome: Progressing   Problem: Clinical Measurements: Goal: Respiratory complications will improve Outcome: Progressing Goal: Cardiovascular complication will be avoided Outcome: Progressing   Problem: Safety: Goal: Ability to remain free from injury will improve Outcome: Progressing   Problem: Skin Integrity: Goal: Risk for impaired skin integrity will decrease Outcome: Progressing   

## 2019-04-13 NOTE — Assessment & Plan Note (Addendum)
#   74 year old male patient with multiple medical problems is currently admitted hospital for sepsis/question pneumonia-incidentally noted to have bulky mediastinal adenopathy/right kidney mass  # Bulky mediastinal adenopathy [incidental]-right paratracheal; right hilar lymphadenopathy -s/p biopsy positive for small cell cancer. [See discussion below].   #Right kidney mass ~4 cm in size-enhancing; concerning for RCC.  This seems to be unrelated to the positive malignancy noted in the mediastinal lymph nodes.  #Sepsis/pneumonia-broad-spectrum antibiotics.  Improving.  #Morbid obesity/borderline performance status/history of TBI.  #Debility secondary to hospitalization-rehab vs. Home with hospice.   Recommendations/plan:  #Reviewed the patient's recent CT scan of the brain-negative for any brain metastasis.  I would still recommend no further active treatments [chemotherapy or radiation] patient's poor performance status/ TBI etc. I would defer to patient/family [based on circumstances/Covid/family preference] to decide on disposition-rehab with close cardiac follow-up vs. home with hospice.  Discussed with [employer]. Oncology will follow as needed.
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
## 2019-04-14 ENCOUNTER — Encounter: Payer: Self-pay | Admitting: Internal Medicine

## 2019-04-14 ENCOUNTER — Other Ambulatory Visit: Payer: Self-pay

## 2019-04-14 DIAGNOSIS — R590 Localized enlarged lymph nodes: Secondary | ICD-10-CM

## 2019-04-14 DIAGNOSIS — I5033 Acute on chronic diastolic (congestive) heart failure: Secondary | ICD-10-CM

## 2019-04-14 LAB — BASIC METABOLIC PANEL
Anion gap: 7 (ref 5–15)
BUN: 33 mg/dL — ABNORMAL HIGH (ref 8–23)
CO2: 32 mmol/L (ref 22–32)
Calcium: 7.6 mg/dL — ABNORMAL LOW (ref 8.9–10.3)
Chloride: 104 mmol/L (ref 98–111)
Creatinine, Ser: 0.91 mg/dL (ref 0.61–1.24)
GFR calc Af Amer: >60 mL/min (ref >60)
GFR calc non Af Amer: >60 mL/min (ref >60)
Glucose, Bld: 136 mg/dL — ABNORMAL HIGH (ref 70–99)
Potassium: 4 mmol/L (ref 3.5–5.1)
Sodium: 143 mmol/L (ref 135–145)

## 2019-04-14 LAB — ECHOCARDIOGRAM COMPLETE
Height: 74 in
Weight: 6088 oz

## 2019-04-14 LAB — BLOOD GAS, ARTERIAL
Acid-Base Excess: 6.5 mmol/L — ABNORMAL HIGH (ref 0.0–2.0)
Bicarbonate: 34.5 mmol/L — ABNORMAL HIGH (ref 20.0–28.0)
FIO2: 0.4
O2 Saturation: 91.1 %
Patient temperature: 37
pCO2 arterial: 64 mmHg — ABNORMAL HIGH (ref 32.0–48.0)
pH, Arterial: 7.34 — ABNORMAL LOW (ref 7.350–7.450)
pO2, Arterial: 65 mmHg — ABNORMAL LOW (ref 83.0–108.0)

## 2019-04-14 LAB — HEMOGLOBIN A1C
Hgb A1c MFr Bld: 6.1 % — ABNORMAL HIGH (ref 4.8–5.6)
Mean Plasma Glucose: 128 mg/dL

## 2019-04-14 LAB — GLUCOSE, CAPILLARY
Glucose-Capillary: 126 mg/dL — ABNORMAL HIGH (ref 70–99)
Glucose-Capillary: 236 mg/dL — ABNORMAL HIGH (ref 70–99)
Glucose-Capillary: 140 mg/dL — ABNORMAL HIGH (ref 70–99)
Glucose-Capillary: 156 mg/dL — ABNORMAL HIGH (ref 70–99)

## 2019-04-14 MED ORDER — FUROSEMIDE 10 MG/ML IJ SOLN
80.0000 mg | Freq: Two times a day (BID) | INTRAMUSCULAR | Status: DC
  Administered 2019-04-14 – 2019-04-18 (×9): 80 mg via INTRAVENOUS
  Filled 2019-04-14 (×9): qty 8

## 2019-04-14 NOTE — Plan of Care (Signed)
°  Problem: Education: °Goal: Knowledge of General Education information will improve °Description: Including pain rating scale, medication(s)/side effects and non-pharmacologic comfort measures °Outcome: Progressing °  °Problem: Clinical Measurements: °Goal: Cardiovascular complication will be avoided °Outcome: Progressing °  °Problem: Activity: °Goal: Risk for activity intolerance will decrease °Outcome: Progressing °  °
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
## 2019-04-14 NOTE — Progress Notes (Signed)
Dr Lanney Gins at bedside speaking with pt and wife. Also spoke with daughter on phone. Consent obtained for Mediastinal Biopsy from spouse. Verbalizes understanding and denies questions. Pt informed of NPO after MN and needs reenforcement.

## 2019-04-14 NOTE — TOC Initial Note (Signed)
Transition of Care [facility]) - Initial/Assessment Note    Patient Details  Name: Scott Simpson MRN: [id number] Date of Birth: Mar 18, 1945  [facility] CM/SW Contact:    Victorino Dike, RN Phone Number: 04/14/2019, 1:39 PM  Clinical Narrative:                 RNCM met with patient for assessment.  Patient sitting up in bed, wife in bedside chair.  He reports using [employer] and they deliver medications.  Husband and wife are independent and drive.  Patient uses a walker at home and has a grab bar beside toilet to help stand.  Patient is currently on acute oxygen, will continue to follow for needs at discharge.  Expected Discharge Plan: [place] Barriers to Discharge: Continued Medical Work up   Patient Goals and CMS Choice   CMS Medicare.gov Compare Post Acute Care list provided to:: Patient Choice offered to / list presented to : Patient, Spouse  Expected Discharge Plan and Services Expected Discharge Plan: [place]   Discharge Planning Services: CM Consult   Living arrangements for the past 2 months: Single Family Home                                      Prior Living Arrangements/Services Living arrangements for the past 2 months: Single Family Home Lives with:: Self, Spouse Patient language and need for interpreter reviewed:: Yes Do you feel safe going back to the place where you live?: Yes      Need for Family Participation in Patient Care: No (Comment) Care giver support system in place?: Yes (comment) Current home services: DME((walker and grab bar by toilet)) Criminal Activity/Legal Involvement Pertinent to Current Situation/Hospitalization: No - Comment as needed  Activities of Daily Living Home Assistive Devices/Equipment: Cane (specify quad or straight), Walker (specify type), Grab bars in shower, Dentures (specify type), Grab bars around toilet, CBG Meter, Blood pressure cuff ADL Screening  (condition at time of admission) Patient's cognitive ability adequate to safely complete daily activities?: Yes Is the patient deaf or have difficulty hearing?: No Does the patient have difficulty seeing, even when wearing glasses/contacts?: No Does the patient have difficulty concentrating, remembering, or making decisions?: No Patient able to express need for assistance with ADLs?: Yes Does the patient have difficulty dressing or bathing?: No Independently performs ADLs?: Yes (appropriate for developmental age) Does the patient have difficulty walking or climbing stairs?: Yes Weakness of Legs: None Weakness of Arms/Hands: None  Permission Sought/Granted                  Emotional Assessment Appearance:: Appears stated age Attitude/Demeanor/Rapport: Engaged Affect (typically observed): Accepting Orientation: : Oriented to Self, Oriented to Place, Oriented to  Time, Oriented to Situation Alcohol / Substance Use: Not Applicable Psych Involvement: No (comment)  Admission diagnosis:  Adrenal nodule (HCC) [E27.8] Hypoxia [R09.02] Mediastinal lymphadenopathy [R59.0] Hilar lymphadenopathy [R59.0] Sepsis ([place]) [A41.9] Mass of kidney with impaired renal function [N28.89] Patient Active Problem List   Diagnosis Date Noted  . History of traumatic brain injury 04/12/2019  . 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 Acute respiratory failure with hypoxia ([place]) 04/12/2019  . Hilar lymphadenopathy 04/12/2019  . AKI (acute kidney injury) ([place]) 04/12/2019  . Sepsis ([place]) 04/12/2019  . Acute infectious diarrhea 04/12/2019  . Bilateral pneumonia 04/12/2019  .  Elevated troponin 04/12/2019  . Impaired mobility 11/21/2015  . Abnormal ECG 11/15/2014  . Adenoma of rectum 11/15/2014  . Diabetes mellitus with complication, with long-term current use of insulin ([place]) 11/15/2014  . Chronic diastolic CHF (congestive heart failure) ([place]) 11/15/2014  . Edema 11/15/2014  . ED  (erectile dysfunction) of organic origin 11/15/2014  . H/O head injury 11/15/2014  . Hearing loss 11/15/2014  . HLD (hyperlipidemia) 11/15/2014  . Hypertension 11/15/2014  . Kidney stone 11/15/2014  . Morbid obesity ([place]) 11/15/2014  . Seborrhea 11/15/2014  . Spinal stenosis of lumbar region 11/15/2014  . Fatty infiltration of liver 07/11/2010  . History of colon polyps 04/01/2002   PCP:  Birdie Sons, MD Pharmacy:   [facility], [address] [place] [address] Phone: [phone] Fax: [phone]  [facility] [place], [address] [place] [address] Phone: [phone] Fax: [phone]     Social Determinants of Health (SDOH) Interventions    Readmission Risk Interventions No flowsheet data found.

## 2019-04-14 NOTE — Progress Notes (Signed)
Scott Simpson   DOB:1945-05-13   KD#:[id number]    Subjective: Patient resting in the bed comfortably.  No acute events.  Objective:  Vitals:   04/14/19 1629 04/14/19 1944  BP: (!) 144/66 127/63  Pulse: 78 75  Resp: 18 18  Temp:  99.3 F (37.4 C)  SpO2: 93% 95%     Intake/Output Summary (Last 24 hours) at 04/14/2019 2058 Last data filed at 04/14/2019 1800 Gross per 24 hour  Intake 681.58 ml  Output 3100 ml  Net -2418.42 ml    Physical Exam  Constitutional: He is oriented to person, place, and time and well-developed, well-nourished, and in no distress.  Morbidly obese resting in the bed comfortably.  O2 nasal cannula.  HENT:  Head: Normocephalic and atraumatic.  Mouth/Throat: Oropharynx is clear and moist. No oropharyngeal exudate.  Eyes: Pupils are equal, round, and reactive to light.  Cardiovascular: Normal rate and regular rhythm.  Pulmonary/Chest: No respiratory distress. He has no wheezes.  Decreased air entry bilaterally the bases.  Abdominal: Soft. Bowel sounds are normal. He exhibits no distension and no mass. There is no abdominal tenderness. There is no rebound and no guarding.  Musculoskeletal:        General: No tenderness or edema. Normal range of motion.     Cervical back: Normal range of motion and neck supple.  Neurological: He is alert and oriented to person, place, and time.  Skin: Skin is warm.  Psychiatric: Affect normal.     Labs:  Lab Results  Component Value Date   WBC 6.7 04/13/2019   HGB 12.1 (L) 04/13/2019   HCT 40.3 04/13/2019   MCV 95.7 04/13/2019   PLT 191 04/13/2019   NEUTROABS 8.2 (H) 04/12/2019    Lab Results  Component Value Date   NA 143 04/14/2019   K 4.0 04/14/2019   CL 104 04/14/2019   CO2 32 04/14/2019    Studies:  CT ABDOMEN PELVIS W WO CONTRAST  Result Date: 04/13/2019 CLINICAL DATA:  Right renal mass on CT chest EXAM: CT ABDOMEN AND PELVIS WITHOUT AND WITH CONTRAST TECHNIQUE: Multidetector CT imaging of the abdomen and  pelvis was performed following the standard protocol before and following the bolus administration of intravenous contrast. CONTRAST:  160mL OMNIPAQUE IOHEXOL 300 MG/ML  SOLN COMPARISON:  Partial comparison to CTA chest dated 04/12/2019. FINDINGS: Motion degraded images. Lower chest: Minimal dependent atelectasis at the lung bases. Hepatobiliary: Mild hepatic steatosis with focal fatty sparing along the gallbladder fossa. Gallbladder is unremarkable. No intrahepatic or extrahepatic ductal dilatation. Pancreas: Within normal limits. Spleen: Within normal limits. Adrenals/Urinary Tract: Adrenal glands are within normal limits. 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 Multiple bilateral renal cysts, measuring up to 3.5 cm in the anterior left upper kidney (series 7/image 47), benign (Bosniak I). No renal calculi or hydronephrosis. Bladder is within normal limits. Stomach/Bowel: Stomach is notable for a tiny hiatal hernia. No evidence of bowel obstruction. Normal appendix (series 12/image 120). Sigmoid diverticulosis, without evidence of diverticulitis. Vascular/Lymphatic: No evidence of abdominal aortic aneurysm. Atherosclerotic calcifications of the abdominal aorta and branch vessels. Single right renal artery. Accessory right lower pole renal vein (series 7/image 60). No renal vein invasion. No suspicious abdominopelvic lymphadenopathy. Small retroperitoneal lymph nodes, including a 7 mm short axis right retrocaval node (series 7/image 85), within normal limits. Reproductive: Prostate is unremarkable. Other:  No abdominopelvic ascites. Musculoskeletal: Degenerative changes of the visualized thoracolumbar spine. 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 Single right renal artery. Accessory right lower pole renal vein. No renal vein invasion. No regional lymphadenopathy or metastatic disease. Electronically Signed   By: Julian Hy M.D.   On: 04/13/2019 17:07   ECHOCARDIOGRAM COMPLETE  Result Date: 04/14/2019    ECHOCARDIOGRAM REPORT   Patient Name:   Scott Simpson Date of Exam: 04/13/2019 Medical Rec #:  [id number]      Height:       74.0 in Accession #:    [id number]     Weight:       380.5 lb Date of Birth:  1946-03-03      BSA:          2.86 m Patient Age:    11 years       BP:           108/47 mmHg Patient Gender: M              HR:           76 bpm. Exam Location:  [facility] Procedure: 2D Echo, Cardiac Doppler and Color Doppler Indications:     CHF 428.31  History:         Patient has no prior history of Echocardiogram examinations.                  Risk Factors:Hypertension.  Sonographer:     Alyse Low Roar Referring Phys:  [place] Diagnosing Phys: Nelva Bush MD IMPRESSIONS  1. Left ventricular ejection fraction, by estimation, is 65 to 70%. The left ventricle has normal function. The left ventrical has no regional wall motion abnormalities. The left ventricular internal cavity size was moderately dilated. There is severely  increased left ventricular hypertrophy. Left ventricular diastolic parameters are consistent with Grade I diastolic dysfunction (impaired relaxation). Elevated left ventricular pressure.  2. Right ventricular systolic function is mildly reduced. The right ventricular size is normal. moderately increased right ventricular wall thickness.  3. Right atrial size was mildly dilated.  4. Trivial mitral valve regurgitation.  5. The aortic valve was not well visualized. Aortic valve regurgitation is not visualized. There is no significant aortic stenosis. FINDINGS  Left Ventricle: Left ventricular ejection fraction, by estimation, is 65 to 70%. The left ventricle has normal function. The left ventricle has no regional  wall motion abnormalities. The left ventricular internal cavity size was moderately dilated. There is severely increased left ventricular hypertrophy. Elevated left ventricular pressure. Right Ventricle: The right ventricular size is normal. Moderately increased right ventricular wall thickness. Right ventricular systolic function is mildly reduced. Left Atrium: Left atrial size was normal in size. Right Atrium: Right atrial size was mildly dilated. Pericardium: The pericardium was not well visualized. Mitral Valve: The mitral valve was not well visualized. Moderate mitral annular calcification. Trivial mitral valve regurgitation. No evidence of mitral valve stenosis. Tricuspid Valve: The tricuspid valve is not well visualized. Tricuspid valve regurgitation is mild. Aortic Valve: The aortic valve was not well visualized. Aortic valve regurgitation is not visualized. There is no significant aortic stenosis. Aortic valve mean gradient measures 5.0 mmHg. Aortic valve peak gradient measures 9.7 mmHg. Aortic valve area, by VTI measures 3.23 cm. Pulmonic Valve: The pulmonic valve was not well visualized. Pulmonic valve regurgitation is not visualized. No evidence of pulmonic  stenosis. Aorta: The aortic root is normal in size and structure. Pulmonary Artery: The pulmonary artery is mildly dilated. Venous: The inferior vena cava was not well visualized. IAS/Shunts: The interatrial septum was not well visualized.  LEFT VENTRICLE PLAX 2D LVIDd:         6.30 cm  Diastology LVIDs:         4.65 cm  LV e' lateral:   6.64 cm/s LV PW:         1.59 cm  LV E/e' lateral: 15.4 LV IVS:        1.90 cm  LV e' medial:    5.87 cm/s LVOT diam:     2.10 cm  LV E/e' medial:  17.4 LV SV:         93.86 ml LV SV Index:   32.83 LVOT Area:     3.46 cm  RIGHT VENTRICLE RV S prime:     12.70 cm/s LEFT ATRIUM             Index       RIGHT ATRIUM           Index LA diam:        5.00 cm 1.75 cm/m  RA Area:     19.10 cm LA Vol (A2C):   88.2 ml 30.87  ml/m RA Volume:   58.20 ml  20.37 ml/m LA Vol (A4C):   67.5 ml 23.63 ml/m LA Biplane Vol: 79.2 ml 27.72 ml/m  AORTIC VALVE                   PULMONIC VALVE AV Area (Vmax):    3.20 cm    PV Vmax:        1.40 m/s AV Area (Vmean):   3.40 cm    PV Peak grad:   7.8 mmHg AV Area (VTI):     3.23 cm    RVOT Peak grad: 6 mmHg AV Vmax:           156.00 cm/s AV Vmean:          96.600 cm/s AV VTI:            0.291 m AV Peak Grad:      9.7 mmHg AV Mean Grad:      5.0 mmHg LVOT Vmax:         144.00 cm/s LVOT Vmean:        94.800 cm/s LVOT VTI:          0.271 m LVOT/AV VTI ratio: 0.93  AORTA Ao Root diam: 3.70 cm MITRAL VALVE MV Area (PHT): 3.77 cm              SHUNTS MV Decel Time: 201 msec              Systemic VTI:  0.27 m MV E velocity: 102.00 cm/s 103 cm/s  Systemic Diam: 2.10 cm MV A velocity: 140.00 cm/s 70.3 cm/s MV E/A ratio:  0.73        1.5 Harrell Gave End MD Electronically signed by Nelva Bush MD Signature Date/Time: 04/14/2019/9:53:56 AM    Final     Hilar lymphadenopathy #74 year old male patient with multiple medical problems is currently admitted hospital for sepsis/question pneumonia-incidentally noted to have bulky mediastinal adenopathy/right kidney mass  # Bulky mediastinal adenopathy [incidental]-right paratracheal; right hilar lymphadenopathy without any obvious evidence of primary [except see below].  Question reactive-bilateral pneumonia versus malignant-unclear if metastatic/primary.  Discussed with pulmonary Dr.Aleskerov-who kindly agrees to evaluate the patient-with  EBUS/biopsy.   #Right kidney mass ~4 cm in size-enhancing; concerning for RCC.  No abdominal pelvic lymphadenopathy/with no evidence of any local metastatic disease.  See plan above.  We also reviewed the tumor conference.  #Sepsis/pneumonia-broad-spectrum antibiotics.  Improving.  #Morbid obesity/borderline performance status/history of TBI.  #Discussed with Dr.Dhungel.    Cammie Sickle, MD 04/14/2019  8:58  PM
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
## 2019-04-15 ENCOUNTER — Inpatient Hospital Stay: Payer: Medicare Other | Admitting: Anesthesiology

## 2019-04-15 ENCOUNTER — Encounter
Admission: EM | Disposition: A | Discharge status: Skilled Nursing Facility | Payer: Self-pay | Source: Home / Self Care | Attending: Internal Medicine

## 2019-04-15 ENCOUNTER — Encounter: Payer: Self-pay | Admitting: Internal Medicine

## 2019-04-15 HISTORY — PX: BIOPSY OF MEDIASTINAL MASS: SHX6389

## 2019-04-15 LAB — BASIC METABOLIC PANEL
Anion gap: 8 (ref 5–15)
BUN: 30 mg/dL — ABNORMAL HIGH (ref 8–23)
CO2: 33 mmol/L — ABNORMAL HIGH (ref 22–32)
Calcium: 7.8 mg/dL — ABNORMAL LOW (ref 8.9–10.3)
Chloride: 103 mmol/L (ref 98–111)
Creatinine, Ser: 0.83 mg/dL (ref 0.61–1.24)
GFR calc Af Amer: >60 mL/min (ref >60)
GFR calc non Af Amer: >60 mL/min (ref >60)
Glucose, Bld: 138 mg/dL — ABNORMAL HIGH (ref 70–99)
Potassium: 4 mmol/L (ref 3.5–5.1)
Sodium: 144 mmol/L (ref 135–145)

## 2019-04-15 LAB — GLUCOSE, CAPILLARY
Glucose-Capillary: 132 mg/dL — ABNORMAL HIGH (ref 70–99)
Glucose-Capillary: 140 mg/dL — ABNORMAL HIGH (ref 70–99)
Glucose-Capillary: 135 mg/dL — ABNORMAL HIGH (ref 70–99)
Glucose-Capillary: 128 mg/dL — ABNORMAL HIGH (ref 70–99)
Glucose-Capillary: 137 mg/dL — ABNORMAL HIGH (ref 70–99)

## 2019-04-15 SURGERY — BIOPSY, MASS, MEDIASTINUM
Anesthesia: General | Laterality: Bilateral

## 2019-04-15 MED ORDER — IPRATROPIUM-ALBUTEROL 0.5-2.5 (3) MG/3ML IN SOLN
RESPIRATORY_TRACT | Status: AC
  Administered 2019-04-15: 3 mL via RESPIRATORY_TRACT
  Filled 2019-04-15: qty 3

## 2019-04-15 MED ORDER — SUGAMMADEX SODIUM 500 MG/5ML IV SOLN: INTRAVENOUS | Status: DC | PRN

## 2019-04-15 MED ORDER — MIDAZOLAM HCL 2 MG/2ML IJ SOLN
INTRAMUSCULAR | Status: AC
  Filled 2019-04-15: qty 2

## 2019-04-15 MED ORDER — PROPOFOL 500 MG/50ML IV EMUL
INTRAVENOUS | Status: AC
  Filled 2019-04-15: qty 50

## 2019-04-15 MED ORDER — PHENYLEPHRINE HCL 0.25 % NA SOLN
1.0000 | Freq: Four times a day (QID) | NASAL | Status: DC | PRN
  Filled 2019-04-15: qty 15

## 2019-04-15 MED ORDER — PROPOFOL 10 MG/ML IV BOLUS
INTRAVENOUS | Status: AC
  Filled 2019-04-15: qty 20

## 2019-04-15 MED ORDER — LIDOCAINE HCL (PF) 1 % IJ SOLN
30.0000 mL | Freq: Once | INTRAMUSCULAR | Status: DC
  Filled 2019-04-15: qty 30

## 2019-04-15 MED ORDER — ROCURONIUM BROMIDE 100 MG/10ML IV SOLN
INTRAVENOUS | Status: DC | PRN
  Administered 2019-04-15: 40 mg via INTRAVENOUS
  Administered 2019-04-15: 10 mg via INTRAVENOUS

## 2019-04-15 MED ORDER — SODIUM CHLORIDE FLUSH 0.9 % IV SOLN
INTRAVENOUS | Status: AC
  Filled 2019-04-15: qty 10

## 2019-04-15 MED ORDER — SUCCINYLCHOLINE CHLORIDE 20 MG/ML IJ SOLN
INTRAMUSCULAR | Status: DC | PRN
  Administered 2019-04-15: 200 mg via INTRAVENOUS

## 2019-04-15 MED ORDER — LIDOCAINE HCL URETHRAL/MUCOSAL 2 % EX GEL
CUTANEOUS | Status: AC
  Filled 2019-04-15: qty 5

## 2019-04-15 MED ORDER — LIDOCAINE HCL URETHRAL/MUCOSAL 2 % EX GEL
1.0000 "application " | Freq: Once | CUTANEOUS | Status: DC
  Filled 2019-04-15: qty 5

## 2019-04-15 MED ORDER — FENTANYL CITRATE (PF) 100 MCG/2ML IJ SOLN
INTRAMUSCULAR | Status: DC | PRN
  Administered 2019-04-15: 50 ug via INTRAVENOUS

## 2019-04-15 MED ORDER — LIDOCAINE HCL (PF) 2 % IJ SOLN
INTRAMUSCULAR | Status: AC
  Filled 2019-04-15: qty 10

## 2019-04-15 MED ORDER — SUGAMMADEX SODIUM 200 MG/2ML IV SOLN
INTRAVENOUS | Status: DC | PRN
  Administered 2019-04-15: 350 mg via INTRAVENOUS

## 2019-04-15 MED ORDER — PROPOFOL 10 MG/ML IV BOLUS
INTRAVENOUS | Status: DC | PRN
  Administered 2019-04-15: 150 mg via INTRAVENOUS
  Administered 2019-04-15: 100 ug/kg/min via INTRAVENOUS

## 2019-04-15 MED ORDER — FENTANYL CITRATE (PF) 100 MCG/2ML IJ SOLN
INTRAMUSCULAR | Status: AC
  Filled 2019-04-15: qty 2

## 2019-04-15 MED ORDER — IPRATROPIUM-ALBUTEROL 0.5-2.5 (3) MG/3ML IN SOLN: 3.0000 mL | Freq: Once | RESPIRATORY_TRACT | Status: AC

## 2019-04-15 MED ORDER — BUTAMBEN-TETRACAINE-BENZOCAINE 2-2-14 % EX AERO
1.0000 | INHALATION_SPRAY | Freq: Once | CUTANEOUS | Status: DC
  Filled 2019-04-15: qty 5

## 2019-04-15 MED ORDER — PHENYLEPHRINE HCL (PRESSORS) 10 MG/ML IV SOLN
INTRAVENOUS | Status: DC | PRN
  Administered 2019-04-15 (×2): 200 ug via INTRAVENOUS
  Administered 2019-04-15: 100 ug via INTRAVENOUS
  Administered 2019-04-15: 200 ug via INTRAVENOUS

## 2019-04-15 NOTE — Progress Notes (Signed)
Stem Dr Amie Critchley and Dr Lanney Gins notified of pt Telemetry report of V tach.  EKG done and Monitor placed.  EKG reviewed by both MDs. Will proceed with bronch.

## 2019-04-15 NOTE — Anesthesia Procedure Notes (Signed)
Procedure Name: Intubation Date/Time: 04/15/2019 1:18 PM Performed by: Allean Found, CRNA Pre-anesthesia Checklist: Patient identified, Patient being monitored, Timeout performed, Emergency Drugs available and Suction available Patient Re-evaluated:Patient Re-evaluated prior to induction Oxygen Delivery Method: Circle system utilized Preoxygenation: Pre-oxygenation with 100% oxygen Induction Type: IV induction Ventilation: Mask ventilation without difficulty Laryngoscope Size: McGraph and 4 Grade View: Grade I Tube type: Oral Tube size: 7.5 mm Number of attempts: 1 Airway Equipment and Method: Stylet Placement Confirmation: ETT inserted through vocal cords under direct vision,  positive ETCO2 and breath sounds checked- equal and bilateral Secured at: 21 cm Tube secured with: Tape Dental Injury: Teeth and Oropharynx as per pre-operative assessment  Difficulty Due To: Difficult Airway- due to reduced neck mobility and Difficult Airway- due to large tongue
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
## 2019-04-15 NOTE — Progress Notes (Signed)
Nurse called by telemetry to say that pt had a really long run of vtach. Nurse called and reported to [facility] same day surgery where pt is currently getting a lung biopsy

## 2019-04-15 NOTE — Care Management Important Message (Signed)
Important Message  Patient Details  Name: Scott Simpson MRN: [id number] Date of Birth: Nov 19, 1945   Medicare Important Message Given:  Yes     [name] 04/15/2019, 11:42 AM

## 2019-04-15 NOTE — Procedures (Signed)
ENDOBRONCHIAL ULTRASOUND PROCEDURE NOTE  FIBEROPTIC BRONCHOSCOPY WITH BAL PROCEDURE NOTE  Flexible bronchoscopy was performed on 04/15/19 by : Lanney Gins MD  assistance by : 1)Shannon Mabe RT  and 2)Jana and Marybeth - [facility] 3)   Indication for the procedure was :  Pre-procedural H&P. The following assessment was performed on the day of the procedure prior to initiating sedation History:  Chest pain none Dyspnea yes Hemoptysis no Cough no Fever no Other pertinent items yes  Examination Vital signs -reviewed as per nursing documentation today Cardiac    Murmurs: no  Rubs : no  Gallop: no Lungs Wheezing: no Rales : yes Rhonchi {:mild  Other pertinent findings: yes   Pre-procedural assessment for Procedural Sedation included: Depth of sedation: As per anesthesia team  ASA Classification:  3 Mallampati airway assessment: 3    Medication list reviewed: yes  The patient's interval history was taken and revealed: no new complaints The pre- procedure physical examination revealed: No new findings Refer to prior clinic note for details.  Informed Consent: Informed consent was obtained from:  patient after explanation of procedure and risks, benefits, as well as alternative procedures available.  Explanation of level of sedation and possible transfusion was also provided.    Procedural Preparation: Time out was performed and patient was identified by name and birthdate and procedure to be performed and side for sampling, if any, was specified. Pt was intubated by anesthesia.  The patient was appropriately draped.  Fiberoptic bronchoscopy Procedure Findings: Bronchoscope was inserted via ETT  without difficulty.  Posterior oropharynx, epiglottis, arytenoids, false cords and vocal cords were not visualized as these were bypassed by endotracheal tube.   The distal trachea was normal in circumference and appearance without mucosal, cartilaginous or branching  abnormalities.  The main carina was splayed .   All right and left lobar airways were visualized to the Sub-segmental level.  Sub- sub segmental carinae were identified in all the distal airways.   Secretions were visible in the following airways and appeared to be clear.  The mucosa was : non-friable  Airways were notable for:        exophytic lesions :none       extrinsic compression in the following distributions: none.       Friable mucosa: none       Anthrocotic material /pigmentation: none   Pictorial documentation attached: n/a     Broncho-alveolar lavage site:RML  sent for cytology                             20 ml volume infused 10 ml volume returned with serosang appearance  Post procedure diagnosis: preliminary> malignancy     ENDOBRONCHIAL ULTRASOUND ASSISTED LYMPH NODE BIOPSIES PROCEDURE FINDINGS: -------------------------------------------------------------------- The fiberoptic bronchoscope was removed and the EBUS scope was introduced. Examination began to evaluate for pathologically enlarged lymph nodes starting on the Left side progressing to the right in the following sequence: 11L>10L>4L>7>4R.  All lymph node biopsies performed with 21G needle. Lymph node biopsies were sent in cytolite for all stations. RPMI solution was utilized to evaluate for possible lymphoma.  Station 10 L was evaluated first and was found to be 1.9 cm of station was biopsied 3 times for CytoLyt solution and once more for RPMI solution.  Good lymphoid shedding was found on all passes and lesional malignant cells were reported on preliminary findings from [facility].  Next station 7 was evaluated and found to be 2.6  cm this was biopsied 3 times for CytoLyt solution as well as 1 time for RPMI solution.  Good lymphoid shedding was found in all passes and lesional malignant cells were reported on preliminary findings from [facility].  Last station was station 10 R which was measured under ultrasound to be  2.9 cm and was biopsied 3 times for CytoLyt solution and 1 pass for RPMI solution.  Good lymphoid shedding was found in all passes and lesional malignant cells reported on preliminary findings from [facility].    STATION NO OF PASSES SAMPLE ADEQUACY DIAGNOSIS  10R- 4 Adequate  7 -     4 Adequate  10L - 4 Adequate       Fluoroscopy Used: no;       Immediate sampling complications included:no Epinephrine 58m was used topically  The bronchoscopy was terminated due to completion of the planned procedure and the bronchoscope was removed.   Total dosage of Lidocaine was 064mTotal fluoroscopy time was 0 minutes    Estimated Blood loss: 1cc.  Complications included:  none immediate    Follow up with Dr. AlLanney Ginsin 5 days for result discussion.     FrClaudette StaplerD  [facility]

## 2019-04-15 NOTE — Transfer of Care (Signed)
Immediate Anesthesia Transfer of Care Note  Patient: Scott Simpson  Procedure(s) Performed: BIOPSY OF MEDIASTINAL MASS (Bilateral )  Patient Location: PACU  Anesthesia Type:General  Level of Consciousness: awake, alert  and oriented  Airway & Oxygen Therapy: Patient Spontanous Breathing and Patient connected to face mask oxygen  Post-op Assessment: Report given to RN and Post -op Vital signs reviewed and stable  Post vital signs: Reviewed and stable  Last Vitals:  Vitals Value Taken Time  BP 123/78 04/15/19 1433  Temp 36.8 C 04/15/19 1431  Pulse 95 04/15/19 1441  Resp 22 04/15/19 1441  SpO2 87 % 04/15/19 1441  Vitals shown include unvalidated device data.  Last Pain:  Vitals:   04/15/19 1217  TempSrc: Tympanic  PainSc: 0-No pain         Complications: No apparent anesthesia complications

## 2019-04-15 NOTE — Anesthesia Postprocedure Evaluation (Signed)
Anesthesia Post Note  Patient: Scott Simpson  Procedure(s) Performed: BIOPSY OF MEDIASTINAL MASS (Bilateral )  Patient location during evaluation: PACU Anesthesia Type: General Level of consciousness: awake and alert Pain management: pain level controlled Vital Signs Assessment: post-procedure vital signs reviewed and stable Respiratory status: spontaneous breathing, nonlabored ventilation, respiratory function stable and patient connected to nasal cannula oxygen Cardiovascular status: blood pressure returned to baseline and stable Postop Assessment: no apparent nausea or vomiting Anesthetic complications: no     Last Vitals:  Vitals:   04/15/19 1500 04/15/19 1503  BP:  (!) 159/72  Pulse: 89 88  Resp: (!) 25 20  Temp:    SpO2: (!) 88% 91%    Last Pain:  Vitals:   04/15/19 1503  TempSrc:   PainSc: 0-No pain                 Scott Simpson

## 2019-04-15 NOTE — Progress Notes (Signed)
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  Continue empiric IV cefepime.  Blood cultures NGTD. Continue on IV Lasix.  Sepsis resolved.  Acute hypoxic respiratory failure: likely secondary to multilobar pneumonia +/-diffuse mediastinal lymphadenopathy.  Tested negative for COVID-19 and flu PCR.  Still requiring 3-4 L via nasal cannula.  Continue on IV lasix.  Acute on chronic diastolic CHF: continue on IV lasix. Strict I/Os & daily weights.  Echo shows EF of 65-70% with grade 1 diastolic dysfunction and severe LVH.  Mediastinal/hilar lymphadenopathy and right renal mass: Noted this admission. Suspicious for RCC with mets versus lymphoma.  LDH elevated.  CT of the abdomen pelvis again shows exophytic mass over the right upper lobe pole of the kidney. Oncology consult appreciated and plans on outpatient PET scan once acute respiratory symptoms resolved. Will go for biopsy today as per pulmon. Pulmon following and recs apprec   Elevated troponin: denies chest pain symptoms.  EKG unremarkable.  Likely secondary to demand ischemia   Essential hypertension: on low end of normal. Will continue to monitor.   Morbid obesity: BMI 48.8. Would benefit from weight loss.   AKI: resolved  Acute  infectious diarrhea: no further symptoms.   DVT prophylaxis: SCDs only as biopsy today  Code Status: full  Family Communication: discussed w/ pt's wife who is bedside and answered her questions  Disposition Plan: mediastinal/hilar biopsy today. Still requiring supplemental oxygen. Will likely need several more days as pt clinically improves.   Consultants:   Pulmon  Onco   Procedures:    Antimicrobials: cefepime    Subjective: Pt c/o shortness of breath   Objective: Vitals:   04/14/19 1629 04/14/19 1944 04/15/19 0334 04/15/19 0738  BP: (!) 144/66 127/63 (!) 126/58 (!) 101/37  Pulse: 78 75 82 78  Resp: 18 18 19 20   Temp:  99.3 F (37.4 C) 99.5 F (37.5 C) 98.3 F (36.8 C)  TempSrc:  Oral Oral Oral  SpO2: 93% 95% 93% 91%  Weight:      Height:        Intake/Output Summary (Last 24 hours) at 04/15/2019 0846 Last data filed at 04/15/2019 0500 Gross per 24 hour  Intake 480 ml  Output 4250 ml  Net -3770 ml   Filed Weights   04/13/19 0246  Weight: (!) 172.6 kg    Examination:  General exam: Appears calm and comfortable. Morbidly obese  Respiratory system: diminished breath sounds b/l. Cardiovascular system: S1 & S2 +.. No , rubs, gallops or clicks. B/l LE edema  Gastrointestinal system: Abdomen is obese, soft and nontender.  Hypoactive bowel sounds heard.  Central nervous system: Alert and oriented. Moves all 4 extremities  Psychiatry: Judgement and insight appear normal. Flat mood and affect     Data  Reviewed: I have personally reviewed following labs and imaging studies  CBC: Recent Labs  Lab 04/12/19 1425 04/13/19 0621  WBC 9.1 6.7  NEUTROABS 8.2*  --   HGB 13.0 12.1*  HCT 43.9 40.3  MCV 96.5 95.7  PLT 218 426   Basic Metabolic Panel: Recent Labs  Lab 04/12/19 1425 04/13/19 0621 04/14/19 0609 04/15/19 0637  NA 140 138 143 144  K 4.5 4.1 4.0 4.0  CL 102 102 104 103  CO2 24 29 32 33*  GLUCOSE 193* 142* 136* 138*  BUN 39* 47* 33* 30*    CREATININE 1.41* 0.96 0.91 0.83  CALCIUM 8.0* 7.7* 7.6* 7.8*   GFR: Estimated Creatinine Clearance: 136.7 mL/min (by C-G formula based on SCr of 0.83 mg/dL). Liver Function Tests: Recent Labs  Lab 04/12/19 1425 04/13/19 0621  AST 28 60*  ALT 18 27  ALKPHOS 39 42  BILITOT 0.6 0.4  PROT 6.3* 6.3*  ALBUMIN 3.1* 3.0*   No results for input(s): LIPASE, AMYLASE in the last 168 hours. No results for input(s): AMMONIA in the last 168 hours. Coagulation Profile: Recent Labs  Lab 04/13/19 0621  INR 1.1   Cardiac Enzymes: No results for input(s): CKTOTAL, CKMB, CKMBINDEX, TROPONINI in the last 168 hours. BNP (last 3 results) No results for input(s): PROBNP in the last 8760 hours. HbA1C: Recent Labs    04/12/19 2218  HGBA1C 6.1*   CBG: Recent Labs  Lab 04/14/19 0755 04/14/19 1137 04/14/19 1630 04/14/19 2054 04/15/19 0734  GLUCAP 126* 236* 140* 156* 132*   Lipid Profile: Recent Labs    04/12/19 1426  TRIG 119   Thyroid Function Tests: No results for input(s): TSH, T4TOTAL, FREET4, T3FREE, THYROIDAB in the last 72 hours. Anemia Panel: Recent Labs    04/12/19 1425  FERRITIN 20*   Sepsis Labs: Recent Labs  Lab 04/12/19 1425 04/12/19 1626 04/13/19 0621  PROCALCITON 0.59  --  0.63  LATICACIDVEN 6.3* 2.2* 0.8    Recent Results (from the past 240 hour(s))  Culture, blood (routine x 2)     Status: None (Preliminary result)   Collection Time: 04/12/19  2:25 PM   Specimen: BLOOD  Result Value Ref Range Status   Specimen Description BLOOD BLOOD LEFT HAND  Final   Special Requests   Final    BOTTLES DRAWN AEROBIC AND ANAEROBIC Blood Culture adequate volume   Culture   Final    NO GROWTH 3 DAYS Performed at [facility], [address] [place], [place] [address]    Report Status PENDING  Incomplete  Culture, blood (routine x 2)     Status: None (Preliminary result)   Collection Time: 04/12/19  2:30 PM   Specimen: BLOOD  Result Value Ref Range  Status   Specimen Description BLOOD BLOOD RIGHT FOREARM  Final   Special Requests   Final    BOTTLES DRAWN AEROBIC AND ANAEROBIC Blood Culture adequate volume   Culture   Final    NO GROWTH 3 DAYS Performed at [facility], [address], [place], [place] [address]    Report Status PENDING  Incomplete  Respiratory Panel by RT PCR (Flu A&B, Covid) - Nasopharyngeal Swab     Status: None   Collection Time: 04/12/19  3:46 PM   Specimen: Nasopharyngeal Swab  Result Value Ref Range Status   SARS Coronavirus 2 by RT PCR NEGATIVE NEGATIVE Final    Comment: (NOTE) SARS-CoV-2 target nucleic acids are NOT DETECTED. The SARS-CoV-2 RNA is generally  detectable in upper respiratoy specimens during the acute phase of infection. The lowest concentration of SARS-CoV-2 viral copies this assay can detect is 131 copies/mL. A negative result does not preclude SARS-Cov-2 infection and should not be used as the sole basis for treatment or other patient management decisions. A negative result may occur with  improper specimen collection/handling, submission of specimen other than nasopharyngeal swab, presence of viral mutation(s) within the areas targeted by this assay, and inadequate number of viral copies (<131 copies/mL). A negative result must be combined with clinical observations, patient history, and epidemiological information. The expected result is Negative. Fact Sheet for Patients:  PinkCheek.be Fact Sheet for Healthcare Providers:  GravelBags.it This test is not yet ap proved or cleared by the [place] FDA and  has been authorized for detection and/or diagnosis of SARS-CoV-2 by FDA under an Emergency Use Authorization (EUA). This EUA will remain  in effect (meaning this test can be used) for the duration of the COVID-19 declaration under Section 564(b)(1) of the Act, 21 U.S.C. section 360bbb-3(b)(1), unless the  authorization is terminated or revoked sooner.    Influenza A by PCR NEGATIVE NEGATIVE Final   Influenza B by PCR NEGATIVE NEGATIVE Final    Comment: (NOTE) The Xpert Xpress SARS-CoV-2/FLU/RSV assay is intended as an aid in  the diagnosis of influenza from Nasopharyngeal swab specimens and  should not be used as a sole basis for treatment. Nasal washings and  aspirates are unacceptable for Xpert Xpress SARS-CoV-2/FLU/RSV  testing. Fact Sheet for Patients: PinkCheek.be Fact Sheet for Healthcare Providers: GravelBags.it This test is not yet approved or cleared by the [place] FDA and  has been authorized for detection and/or diagnosis of SARS-CoV-2 by  FDA under an Emergency Use Authorization (EUA). This EUA will remain  in effect (meaning this test can be used) for the duration of the  Covid-19 declaration under Section 564(b)(1) of the Act, 21  U.S.C. section 360bbb-3(b)(1), unless the authorization is  terminated or revoked. Performed at [facility], [address] [place], [place] [address]          Radiology Studies: CT ABDOMEN PELVIS W WO CONTRAST  Result Date: 04/13/2019 CLINICAL DATA:  Right renal mass on CT chest EXAM: CT ABDOMEN AND PELVIS WITHOUT AND WITH CONTRAST TECHNIQUE: Multidetector CT imaging of the abdomen and pelvis was performed following the standard protocol before and following the bolus administration of intravenous contrast. CONTRAST:  18mL OMNIPAQUE IOHEXOL 300 MG/ML  SOLN COMPARISON:  Partial comparison to CTA chest dated 04/12/2019. FINDINGS: Motion degraded images. Lower chest: Minimal dependent atelectasis at the lung bases. Hepatobiliary: Mild hepatic steatosis with focal fatty sparing along the gallbladder fossa. Gallbladder is unremarkable. No intrahepatic or extrahepatic ductal dilatation. Pancreas: Within normal limits. Spleen: Within normal limits. Adrenals/Urinary Tract:  Adrenal glands are within normal limits. 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 Multiple bilateral renal cysts, measuring up to 3.5 cm in the anterior left upper kidney (series 7/image 47), benign (Bosniak I). No renal calculi or hydronephrosis. Bladder is within normal limits. Stomach/Bowel: Stomach is notable for a tiny hiatal hernia. No evidence of bowel obstruction. Normal appendix (series 12/image 120). Sigmoid diverticulosis, without evidence of diverticulitis. Vascular/Lymphatic: No evidence of abdominal aortic aneurysm. Atherosclerotic calcifications of the abdominal aorta and branch vessels. Single right renal artery. Accessory right lower pole renal vein (series  7/image 60). No renal vein invasion. No suspicious abdominopelvic lymphadenopathy. Small retroperitoneal lymph nodes, including a 7 mm short axis right retrocaval node (series 7/image 85), within normal limits. Reproductive: Prostate is unremarkable. Other: No abdominopelvic ascites. Musculoskeletal: Degenerative changes of the visualized thoracolumbar spine. 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 Single right renal artery. Accessory right lower pole renal vein. No renal vein invasion. No regional lymphadenopathy or metastatic disease. Electronically Signed   By: Julian Hy M.D.   On: 04/13/2019 17:07   ECHOCARDIOGRAM COMPLETE  Result Date: 04/14/2019    ECHOCARDIOGRAM REPORT   Patient Name:   Scott Simpson Date of Exam: 04/13/2019 Medical Rec #:  [id number]      Height:       74.0 in Accession #:    [id number]     Weight:       380.5 lb Date of Birth:  1945/07/22      BSA:          2.86 m Patient Age:    74 years       BP:           108/47 mmHg Patient Gender: M               HR:           76 bpm. Exam Location:  [facility] Procedure: 2D Echo, Cardiac Doppler and Color Doppler Indications:     CHF 428.31  History:         Patient has no prior history of Echocardiogram examinations.                  Risk Factors:Hypertension.  Sonographer:     Alyse Low Roar Referring Phys:  [place] Diagnosing Phys: Nelva Bush MD IMPRESSIONS  1. Left ventricular ejection fraction, by estimation, is 65 to 70%. The left ventricle has normal function. The left ventrical has no regional wall motion abnormalities. The left ventricular internal cavity size was moderately dilated. There is severely  increased left ventricular hypertrophy. Left ventricular diastolic parameters are consistent with Grade I diastolic dysfunction (impaired relaxation). Elevated left ventricular pressure.  2. Right ventricular systolic function is mildly reduced. The right ventricular size is normal. moderately increased right ventricular wall thickness.  3. Right atrial size was mildly dilated.  4. Trivial mitral valve regurgitation.  5. The aortic valve was not well visualized. Aortic valve regurgitation is not visualized. There is no significant aortic stenosis. FINDINGS  Left Ventricle: Left ventricular ejection fraction, by estimation, is 65 to 70%. The left ventricle has normal function. The left ventricle has no regional wall motion abnormalities. The left ventricular internal cavity size was moderately dilated. There is severely increased left ventricular hypertrophy. Elevated left ventricular pressure. Right Ventricle: The right ventricular size is normal. Moderately increased right ventricular wall thickness. Right ventricular systolic function is mildly reduced. Left Atrium: Left atrial size was normal in size. Right Atrium: Right atrial size was mildly dilated. Pericardium: The pericardium was not well visualized. Mitral Valve: The mitral valve was not well visualized. Moderate mitral annular  calcification. Trivial mitral valve regurgitation. No evidence of mitral valve stenosis. Tricuspid Valve: The tricuspid valve is not well visualized. Tricuspid valve regurgitation is mild. Aortic Valve: The aortic valve was not well visualized. Aortic valve regurgitation is not visualized. There is no significant aortic stenosis. Aortic valve mean gradient measures 5.0 mmHg.  Aortic valve peak gradient measures 9.7 mmHg. Aortic valve area, by VTI measures 3.23 cm. Pulmonic Valve: The pulmonic valve was not well visualized. Pulmonic valve regurgitation is not visualized. No evidence of pulmonic stenosis. Aorta: The aortic root is normal in size and structure. Pulmonary Artery: The pulmonary artery is mildly dilated. Venous: The inferior vena cava was not well visualized. IAS/Shunts: The interatrial septum was not well visualized.  LEFT VENTRICLE PLAX 2D LVIDd:         6.30 cm  Diastology LVIDs:         4.65 cm  LV e' lateral:   6.64 cm/s LV PW:         1.59 cm  LV E/e' lateral: 15.4 LV IVS:        1.90 cm  LV e' medial:    5.87 cm/s LVOT diam:     2.10 cm  LV E/e' medial:  17.4 LV SV:         93.86 ml LV SV Index:   32.83 LVOT Area:     3.46 cm  RIGHT VENTRICLE RV S prime:     12.70 cm/s LEFT ATRIUM             Index       RIGHT ATRIUM           Index LA diam:        5.00 cm 1.75 cm/m  RA Area:     19.10 cm LA Vol (A2C):   88.2 ml 30.87 ml/m RA Volume:   58.20 ml  20.37 ml/m LA Vol (A4C):   67.5 ml 23.63 ml/m LA Biplane Vol: 79.2 ml 27.72 ml/m  AORTIC VALVE                   PULMONIC VALVE AV Area (Vmax):    3.20 cm    PV Vmax:        1.40 m/s AV Area (Vmean):   3.40 cm    PV Peak grad:   7.8 mmHg AV Area (VTI):     3.23 cm    RVOT Peak grad: 6 mmHg AV Vmax:           156.00 cm/s AV Vmean:          96.600 cm/s AV VTI:            0.291 m AV Peak Grad:      9.7 mmHg AV Mean Grad:      5.0 mmHg LVOT Vmax:         144.00 cm/s LVOT Vmean:        94.800 cm/s LVOT VTI:          0.271 m LVOT/AV VTI ratio: 0.93   AORTA Ao Root diam: 3.70 cm MITRAL VALVE MV Area (PHT): 3.77 cm              SHUNTS MV Decel Time: 201 msec              Systemic VTI:  0.27 m MV E velocity: 102.00 cm/s 103 cm/s  Systemic Diam: 2.10 cm MV A velocity: 140.00 cm/s 70.3 cm/s MV E/A ratio:  0.73        1.5 Harrell Gave End MD Electronically signed by Nelva Bush MD Signature Date/Time: 04/14/2019/9:53:56 AM    Final         Scheduled Meds: . butamben-tetracaine-benzocaine  1 spray Topical Once  . furosemide  80 mg Intravenous Q12H  . insulin aspart  0-20 Units  Subcutaneous TID WC  . insulin aspart  0-5 Units Subcutaneous QHS  . lidocaine (PF)  30 mL Infiltration Once  . lidocaine  1 application Topical Once  . sodium chloride flush  10 mL Intravenous Q12H   Continuous Infusions: . sodium chloride 250 mL (04/15/19 0614)  . ceFEPime (MAXIPIME) IV 2 g (04/15/19 0615)     LOS: 3 days    Time spent: 33 mins     Wyvonnia Dusky, MD [facility] Pager [phone]  If 7PM-7AM, please contact night-coverage [email or web address] 04/15/2019, 8:46 AM

## 2019-04-16 ENCOUNTER — Other Ambulatory Visit: Payer: Medicare Other

## 2019-04-16 LAB — COMPREHENSIVE METABOLIC PANEL
ALT: 35 U/L (ref 0–44)
AST: 42 U/L — ABNORMAL HIGH (ref 15–41)
Albumin: 3 g/dL — ABNORMAL LOW (ref 3.5–5.0)
Alkaline Phosphatase: 44 U/L (ref 38–126)
Anion gap: 10 (ref 5–15)
BUN: 28 mg/dL — ABNORMAL HIGH (ref 8–23)
CO2: 34 mmol/L — ABNORMAL HIGH (ref 22–32)
Calcium: 7.7 mg/dL — ABNORMAL LOW (ref 8.9–10.3)
Chloride: 100 mmol/L (ref 98–111)
Creatinine, Ser: 0.78 mg/dL (ref 0.61–1.24)
GFR calc Af Amer: >60 mL/min (ref >60)
GFR calc non Af Amer: >60 mL/min (ref >60)
Glucose, Bld: 130 mg/dL — ABNORMAL HIGH (ref 70–99)
Potassium: 3.8 mmol/L (ref 3.5–5.1)
Sodium: 144 mmol/L (ref 135–145)
Total Bilirubin: 0.6 mg/dL (ref 0.3–1.2)
Total Protein: 6.4 g/dL — ABNORMAL LOW (ref 6.5–8.1)

## 2019-04-16 LAB — GLUCOSE, CAPILLARY
Glucose-Capillary: 118 mg/dL — ABNORMAL HIGH (ref 70–99)
Glucose-Capillary: 166 mg/dL — ABNORMAL HIGH (ref 70–99)
Glucose-Capillary: 134 mg/dL — ABNORMAL HIGH (ref 70–99)

## 2019-04-16 LAB — CBC
HCT: 42 % (ref 39.0–52.0)
Hemoglobin: 12.6 g/dL — ABNORMAL LOW (ref 13.0–17.0)
MCH: 28.7 pg (ref 26.0–34.0)
MCHC: 30 g/dL (ref 30.0–36.0)
MCV: 95.7 fL (ref 80.0–100.0)
Platelets: 175 10*3/uL (ref 150–400)
RBC: 4.39 MIL/uL (ref 4.22–5.81)
RDW: 16.2 % — ABNORMAL HIGH (ref 11.5–15.5)
WBC: 5.9 10*3/uL (ref 4.0–10.5)
nRBC: 0 % (ref 0.0–0.2)

## 2019-04-16 MED ORDER — SODIUM CHLORIDE 3 % IN NEBU
4.0000 mL | INHALATION_SOLUTION | Freq: Two times a day (BID) | RESPIRATORY_TRACT | Status: DC
  Administered 2019-04-16 – 2019-04-18 (×3): 4 mL via RESPIRATORY_TRACT
  Filled 2019-04-16 (×4): qty 4

## 2019-04-16 MED ORDER — ENOXAPARIN SODIUM 40 MG/0.4ML ~~LOC~~ SOLN
40.0000 mg | Freq: Two times a day (BID) | SUBCUTANEOUS | Status: DC
  Administered 2019-04-16 – 2019-04-22 (×13): 40 mg via SUBCUTANEOUS
  Filled 2019-04-16 (×13): qty 0.4

## 2019-04-16 NOTE — Progress Notes (Signed)
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  Continue empiric IV cefepime.  Blood cultures NGTD. Continue on IV Lasix.  Sepsis resolved.  Acute hypoxic respiratory failure: likely secondary to multilobar pneumonia +/-diffuse mediastinal lymphadenopathy.  Tested negative for COVID-19 and flu PCR.  Continue on supplemental oxygen & wean as tolerated. Continue on IV lasix. Monitor I/Os.   Acute on chronic diastolic CHF: continue on IV lasix. Strict I/Os & daily weights.  Echo shows EF of 65-70% with grade 1 diastolic dysfunction and severe LVH.  Mediastinal/hilar lymphadenopathy and right renal mass: Noted this admission. Suspicious for RCC with mets versus lymphoma.  LDH elevated.  CT of the abdomen pelvis again shows exophytic mass over the right upper lobe pole of the kidney. Oncology consult appreciated and plans on outpatient PET scan once acute respiratory symptoms resolved. S/p EBUS w/ biopsy 04/15/19 prelim malignant, path pending. Pulmon following and recs apprec   Hx of traumatic brain injury: does not retain any new memory at all as per pt's daughter  Elevated troponin: denies chest pain symptoms.  EKG unremarkable.  Likely secondary to demand ischemia   Essential hypertension:  on low end of normal. Will continue to monitor.   Morbid obesity: BMI 48.8. Would benefit from weight loss.   AKI: resolved  Acute infectious diarrhea: no further symptoms.  Generalized weakness: PT/OT consulted  DVT prophylaxis: lovenox  Code Status: full  Family Communication: discussed w/ pt's daughter, Levada Dy, via phone and answered her questions  Disposition Plan: pathology pending. Still requiring supplemental oxygen. PT/OT consulted.   Consultants:   Pulmon  Onco   Procedures:    Antimicrobials: cefepime    Subjective: Pt c/o fatigue  Objective: Vitals:   04/15/19 1925 04/16/19 0041 04/16/19 0442 04/16/19 0753  BP: (!) 131/56 131/62 (!) 147/51 (!) 120/46  Pulse: 80 65 78 66  Resp: 19 20 20 18   Temp: 97.7 F (36.5 C) 98.5 F (36.9 C) 98.5 F (36.9 C) 98.2 F (36.8 C)  TempSrc: Oral Oral Oral Oral  SpO2: 90% 92% 92% 94%  Weight:   (!) 172.8 kg   Height:        Intake/Output Summary (Last 24 hours) at 04/16/2019 0824 Last data filed at 04/15/2019 2351 Gross per 24 hour  Intake 500 ml  Output 250 ml  Net 250 ml   Filed Weights   04/13/19 0246 04/16/19 0442  Weight: (!) 172.6 kg (!) 172.8 kg    Examination:  General exam: Appears calm and comfortable. Morbidly obese  Respiratory system: decreased breath sounds b/l. Cardiovascular system: S1 & S2 +.. No , rubs, gallops or clicks. B/l LE edema  Gastrointestinal system: Abdomen is obese, soft and nontender.  Hypoactive bowel sounds heard.  Central nervous system: Alert and oriented.  Moves all 4 extremities  Psychiatry: Judgement and insight appear abnormal. Normal mood and affect     Data Reviewed: I have personally reviewed following labs and imaging studies  CBC: Recent Labs  Lab 04/12/19 1425 04/13/19 0621 04/16/19 0324  WBC 9.1 6.7 5.9  NEUTROABS 8.2*  --   --   HGB 13.0 12.1* 12.6*  HCT 43.9 40.3 42.0  MCV 96.5 95.7 95.7  PLT 218 191 678   Basic Metabolic Panel: Recent Labs    Lab 04/12/19 1425 04/13/19 0621 04/14/19 0609 04/15/19 0637 04/16/19 0324  NA 140 138 143 144 144  K 4.5 4.1 4.0 4.0 3.8  CL 102 102 104 103 100  CO2 24 29 32 33* 34*  GLUCOSE 193* 142* 136* 138* 130*  BUN 39* 47* 33* 30* 28*  CREATININE 1.41* 0.96 0.91 0.83 0.78  CALCIUM 8.0* 7.7* 7.6* 7.8* 7.7*   GFR: Estimated Creatinine Clearance: 141.8 mL/min (by C-G formula based on SCr of 0.78 mg/dL). Liver Function Tests: Recent Labs  Lab 04/12/19 1425 04/13/19 0621 04/16/19 0324  AST 28 60* 42*  ALT 18 27 35  ALKPHOS 39 42 44  BILITOT 0.6 0.4 0.6  PROT 6.3* 6.3* 6.4*  ALBUMIN 3.1* 3.0* 3.0*   No results for input(s): LIPASE, AMYLASE in the last 168 hours. No results for input(s): AMMONIA in the last 168 hours. Coagulation Profile: Recent Labs  Lab 04/13/19 0621  INR 1.1   Cardiac Enzymes: No results for input(s): CKTOTAL, CKMB, CKMBINDEX, TROPONINI in the last 168 hours. BNP (last 3 results) No results for input(s): PROBNP in the last 8760 hours. HbA1C: No results for input(s): HGBA1C in the last 72 hours. CBG: Recent Labs  Lab 04/15/19 1125 04/15/19 1435 04/15/19 1633 04/15/19 2120 04/16/19 0750  GLUCAP 140* 135* 128* 137* 118*   Lipid Profile: No results for input(s): CHOL, HDL, LDLCALC, TRIG, CHOLHDL, LDLDIRECT in the last 72 hours. Thyroid Function Tests: No results for input(s): TSH, T4TOTAL, FREET4, T3FREE, THYROIDAB in the last 72 hours. Anemia Panel: No results for input(s): VITAMINB12, FOLATE, FERRITIN, TIBC, IRON, RETICCTPCT in the last 72 hours. Sepsis Labs: Recent Labs  Lab 04/12/19 1425 04/12/19 1626 04/13/19 0621  PROCALCITON 0.59  --  0.63  LATICACIDVEN 6.3* 2.2* 0.8    Recent Results (from the past 240 hour(s))  Culture, blood (routine x 2)     Status: None (Preliminary result)   Collection Time: 04/12/19  2:25 PM   Specimen: BLOOD  Result Value Ref Range Status   Specimen Description BLOOD BLOOD LEFT HAND  Final   Special Requests    Final    BOTTLES DRAWN AEROBIC AND ANAEROBIC Blood Culture adequate volume   Culture   Final    NO GROWTH 4 DAYS Performed at [facility], [address] [place], [place] [address]    Report Status PENDING  Incomplete  Culture, blood (routine x 2)     Status: None (Preliminary result)   Collection Time: 04/12/19  2:30 PM   Specimen: BLOOD  Result Value Ref Range Status   Specimen Description BLOOD BLOOD RIGHT FOREARM  Final   Special Requests   Final    BOTTLES DRAWN AEROBIC AND ANAEROBIC Blood Culture adequate volume   Culture   Final    NO GROWTH 4 DAYS Performed at [facility], [address] [place],  [address]    Report Status PENDING  Incomplete  Respiratory Panel by RT PCR (Flu A&B, Covid) - Nasopharyngeal Swab  Status: None   Collection Time: 04/12/19  3:46 PM   Specimen: Nasopharyngeal Swab  Result Value Ref Range Status   SARS Coronavirus 2 by RT PCR NEGATIVE NEGATIVE Final    Comment: (NOTE) SARS-CoV-2 target nucleic acids are NOT DETECTED. The SARS-CoV-2 RNA is generally detectable in upper respiratoy specimens during the acute phase of infection. The lowest concentration of SARS-CoV-2 viral copies this assay can detect is 131 copies/mL. A negative result does not preclude SARS-Cov-2 infection and should not be used as the sole basis for treatment or other patient management decisions. A negative result may occur with  improper specimen collection/handling, submission of specimen other than nasopharyngeal swab, presence of viral mutation(s) within the areas targeted by this assay, and inadequate number of viral copies (<131 copies/mL). A negative result must be combined with clinical observations, patient history, and epidemiological information. The expected result is Negative. Fact Sheet for Patients:  PinkCheek.be Fact Sheet for Healthcare Providers:   GravelBags.it This test is not yet ap proved or cleared by the [place] FDA and  has been authorized for detection and/or diagnosis of SARS-CoV-2 by FDA under an Emergency Use Authorization (EUA). This EUA will remain  in effect (meaning this test can be used) for the duration of the COVID-19 declaration under Section 564(b)(1) of the Act, 21 U.S.C. section 360bbb-3(b)(1), unless the authorization is terminated or revoked sooner.    Influenza A by PCR NEGATIVE NEGATIVE Final   Influenza B by PCR NEGATIVE NEGATIVE Final    Comment: (NOTE) The Xpert Xpress SARS-CoV-2/FLU/RSV assay is intended as an aid in  the diagnosis of influenza from Nasopharyngeal swab specimens and  should not be used as a sole basis for treatment. Nasal washings and  aspirates are unacceptable for Xpert Xpress SARS-CoV-2/FLU/RSV  testing. Fact Sheet for Patients: PinkCheek.be Fact Sheet for Healthcare Providers: GravelBags.it This test is not yet approved or cleared by the [place] FDA and  has been authorized for detection and/or diagnosis of SARS-CoV-2 by  FDA under an Emergency Use Authorization (EUA). This EUA will remain  in effect (meaning this test can be used) for the duration of the  Covid-19 declaration under Section 564(b)(1) of the Act, 21  U.S.C. section 360bbb-3(b)(1), unless the authorization is  terminated or revoked. Performed at [facility], [address] [place], [place] [address]          Radiology Studies: No results found.      Scheduled Meds: . furosemide  80 mg Intravenous Q12H  . insulin aspart  0-20 Units Subcutaneous TID WC  . insulin aspart  0-5 Units Subcutaneous QHS  . sodium chloride flush  10 mL Intravenous Q12H   Continuous Infusions: . sodium chloride 10 mL/hr at 04/15/19 2125  . ceFEPime (MAXIPIME) IV Stopped (04/16/19 4888)     LOS: 4 days     Time spent: 33 mins     Wyvonnia Dusky, MD [facility] Pager [phone]  If 7PM-7AM, please contact night-coverage [email or web address] 04/16/2019, 8:24 AM

## 2019-04-16 NOTE — TOC Progression Note (Signed)
Transition of Care [facility]) - Progression Note    Patient Details  Name: Scott Simpson MRN: [id number] Date of Birth: 1945/11/08  Transition of Care [facility]) CM/SW [place], RN Phone Number: 04/16/2019, 4:39 PM  Clinical Narrative:     Met with wife today, we had daughter Levada Dy on conference call.  Explained Discharge Planning is dependent on plan and goals of care.  We discussed patients current physical/mental status vs pre hospitalization.  Wife continues to work daily to provide income and support for household.  She is concerned with the new needs her husband has and future care.  We talked about private sitters, etc at discharge.  This is not an option financially at this time for them.  We discussed the need to know future medical goals, treatments and therapies , that these decisions will help guide discharge planning.   Reassured family I will continue following patient and future needs. Hurley Sobel (wife) has expressed she would like her daughter called with updates as she feels her daughter understands the information better and can ask more relevant questions.  Daughters name is Levada Dy   [phone].     Expected Discharge Plan: [place] Barriers to Discharge: Continued Medical Work up  Expected Discharge Plan and Services Expected Discharge Plan: [place]   Discharge Planning Services: CM Consult   Living arrangements for the past 2 months: Single Family Home                                       Social Determinants of Health (SDOH) Interventions    Readmission Risk Interventions No flowsheet data found.
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
## 2019-04-16 NOTE — Progress Notes (Signed)
Tumor Board Documentation  Scott Simpson was presented by Dr Rogue Bussing at our Tumor Board on 04/16/2019, which included representatives from medical oncology, radiation oncology, navigation, pathology, radiology, surgical, surgical oncology, internal medicine, pharmacy, genetics, palliative care, research.  Wali currently presents as a new patient, for discussion with history of the following treatments: active survellience.  Additionally, we reviewed previous medical and familial history, history of present illness, and recent lab results along with all available histopathologic and imaging studies. The tumor board considered available treatment options and made the following recommendations:   EBUS  The following procedures/referrals were also placed: No orders of the defined types were placed in this encounter.   Clinical Trial Status: not discussed   Staging used: Not Applicable  National site-specific guidelines   were discussed with respect to the case.  Tumor board is a meeting of clinicians from various specialty areas who evaluate and discuss patients for whom a multidisciplinary approach is being considered. Final determinations in the plan of care are those of the provider(s). The responsibility for follow up of recommendations given during tumor board is that of the provider.   Today's extended care, comprehensive team conference, Brendt was not present for the discussion and was not examined.   Multidisciplinary Tumor Board is a multidisciplinary case peer review process.  Decisions discussed in the Multidisciplinary Tumor Board reflect the opinions of the specialists present at the conference without having examined the patient.  Ultimately, treatment and diagnostic decisions rest with the primary provider(s) and the patient.

## 2019-04-17 ENCOUNTER — Other Ambulatory Visit: Payer: Self-pay | Admitting: Internal Medicine

## 2019-04-17 LAB — CBC
HCT: 41.6 % (ref 39.0–52.0)
Hemoglobin: 12.7 g/dL — ABNORMAL LOW (ref 13.0–17.0)
MCH: 28.8 pg (ref 26.0–34.0)
MCHC: 30.5 g/dL (ref 30.0–36.0)
MCV: 94.3 fL (ref 80.0–100.0)
Platelets: 172 10*3/uL (ref 150–400)
RBC: 4.41 MIL/uL (ref 4.22–5.81)
RDW: 15.9 % — ABNORMAL HIGH (ref 11.5–15.5)
WBC: 5.9 10*3/uL (ref 4.0–10.5)
nRBC: 0 % (ref 0.0–0.2)

## 2019-04-17 LAB — COMPREHENSIVE METABOLIC PANEL
ALT: 38 U/L (ref 0–44)
AST: 41 U/L (ref 15–41)
Albumin: 2.9 g/dL — ABNORMAL LOW (ref 3.5–5.0)
Alkaline Phosphatase: 48 U/L (ref 38–126)
Anion gap: 9 (ref 5–15)
BUN: 27 mg/dL — ABNORMAL HIGH (ref 8–23)
CO2: 35 mmol/L — ABNORMAL HIGH (ref 22–32)
Calcium: 8.1 mg/dL — ABNORMAL LOW (ref 8.9–10.3)
Chloride: 98 mmol/L (ref 98–111)
Creatinine, Ser: 0.72 mg/dL (ref 0.61–1.24)
GFR calc Af Amer: >60 mL/min (ref >60)
GFR calc non Af Amer: >60 mL/min (ref >60)
Glucose, Bld: 148 mg/dL — ABNORMAL HIGH (ref 70–99)
Potassium: 3.7 mmol/L (ref 3.5–5.1)
Sodium: 142 mmol/L (ref 135–145)
Total Bilirubin: 0.6 mg/dL (ref 0.3–1.2)
Total Protein: 6.2 g/dL — ABNORMAL LOW (ref 6.5–8.1)

## 2019-04-17 LAB — CULTURE, BLOOD (ROUTINE X 2)
Culture: NO GROWTH
Culture: NO GROWTH
Special Requests: ADEQUATE
Special Requests: ADEQUATE

## 2019-04-17 LAB — GLUCOSE, CAPILLARY
Glucose-Capillary: 172 mg/dL — ABNORMAL HIGH (ref 70–99)
Glucose-Capillary: 128 mg/dL — ABNORMAL HIGH (ref 70–99)
Glucose-Capillary: 195 mg/dL — ABNORMAL HIGH (ref 70–99)
Glucose-Capillary: 208 mg/dL — ABNORMAL HIGH (ref 70–99)
Glucose-Capillary: 144 mg/dL — ABNORMAL HIGH (ref 70–99)

## 2019-04-17 LAB — CYTOLOGY - NON PAP
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
## 2019-04-17 NOTE — Plan of Care (Signed)
  Problem: Education: Goal: Knowledge of General Education information will improve Description: Including pain rating scale, medication(s)/side effects and non-pharmacologic comfort measures Outcome: Progressing   Problem: Health Behavior/Discharge Planning: Goal: Ability to manage health-related needs will improve Outcome: Not Progressing Note: Waiting on lung biopsy results and PT/OT final evaluations to influence discharge decision and timing. Will continue to monitor progression for the remainder of the shift. Wenda Low [facility]

## 2019-04-17 NOTE — Progress Notes (Signed)
Pharmacy Antibiotic Note  Scott Simpson is a 74 y.o. male admitted on 04/12/2019 with pneumonia, sepsis and intra-abdominal infection.  Pharmacy has been consulted for Cefepime dosing.   Plan: Continue Cefepime 2g IV q8h (plan is for 7 total days)  Height: 6\' 2"  (188 cm) Weight: (!) 365 lb 12.8 oz (165.9 kg) IBW/kg (Calculated) : 82.2  Temp (24hrs), Avg:98.4 F (36.9 C), Min:98 F (36.7 C), Max:98.6 F (37 C)  Recent Labs  Lab 04/12/19 1425 04/12/19 1425 04/12/19 1626 04/13/19 0621 04/14/19 0609 04/15/19 0637 04/16/19 0324 04/17/19 0524  WBC 9.1  --   --  6.7  --   --  5.9 5.9  CREATININE 1.41*   < >  --  0.96 0.91 0.83 0.78 0.72  LATICACIDVEN 6.3*  --  2.2* 0.8  --   --   --   --    < > = values in this interval not displayed.    Estimated Creatinine Clearance: 138.6 mL/min (by C-G formula based on SCr of 0.72 mg/dL).    No Known Allergies  Antimicrobials this admission: Ceftriaxone  2/7 x 1 in the ED Azithromycin 2/7 >> 2/8 Metronidazole 2/7 >>2/8 Cefepime 2/7 >>  Microbiology results: 2/7 BCx NG5D  Thank you for allowing pharmacy to be a part of this patient's care.  Lu Duffel, PharmD, BCPS Clinical Pharmacist 04/17/2019 8:27 AM

## 2019-04-17 NOTE — Progress Notes (Signed)
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  Continue empiric IV cefepime x 7 days total.  Blood cultures NGTD. Continue on IV Lasix.  Sepsis resolved.  Acute hypoxic respiratory failure: likely secondary to multilobar pneumonia +/-diffuse mediastinal lymphadenopathy.  Tested negative for COVID-19 and flu PCR.  Continue on supplemental oxygen & wean as tolerated. Will likely need oxygen at d/c. Continue on IV lasix. Monitor I/Os.   Acute on chronic diastolic CHF: continue on IV lasix. Strict I/Os & daily weights.  Echo shows EF of 65-70% with grade 1 diastolic dysfunction and severe LVH. Neg fluid balance  Mediastinal/hilar lymphadenopathy and right renal mass: Noted this admission. Suspicious for RCC with mets versus lymphoma.  LDH elevated.  CT of the abdomen pelvis again shows exophytic mass over the right upper lobe pole of the kidney. Oncology consult appreciated and plans on outpatient PET scan once acute respiratory symptoms resolved. S/p EBUS w/ biopsy 04/15/19 prelim malignant, final path pending. Pulmon following and recs apprec   Hx of traumatic brain injury: does not retain any new memory at all as per pt's daughter  Elevated troponin: denies chest pain symptoms.  EKG  unremarkable.  Likely secondary to demand ischemia   Essential hypertension: on low end of normal. Will continue to monitor.   Morbid obesity: BMI 48.8. Would benefit from weight loss.   AKI: resolved  Acute infectious diarrhea: no further symptoms.  Generalized weakness: PT/OT are recommending home health   DVT prophylaxis: lovenox  Code Status: full  Family Communication: called pt's daughter, Levada Dy, no answer but I left a message  Disposition Plan: pathology pending. Still requiring supplemental oxygen. PT/OT recommending home health. Home health orders will be placed   Consultants:   Pulmon  Onco   Procedures:    Antimicrobials: cefepime    Subjective: Pt states wants to go home but I explained why he had to stay in the hospital.   Objective: Vitals:   04/16/19 1933 04/16/19 2030 04/17/19 0408 04/17/19 0732  BP: (!) 150/64  (!) 133/54 (!) 108/37  Pulse: 77  62 66  Resp: 18  16 17   Temp: 98.4 F (36.9 C)  98.6 F (37 C) 98.4 F (36.9 C)  TempSrc: Oral  Oral Oral  SpO2: 91% 92% 93% 92%  Weight:   (!) 165.9 kg   Height:        Intake/Output Summary (Last 24 hours) at 04/17/2019 1194 Last data filed at 04/17/2019 0410 Gross per 24 hour  Intake 720 ml  Output 2650 ml  Net -1930 ml   Filed Weights   04/13/19 0246 04/16/19 0442 04/17/19 0408  Weight: (!) 172.6 kg (!) 172.8 kg (!) 165.9 kg    Examination:  General exam: Appears calm and comfortable. Morbidly obese  Respiratory system: diminished breath sounds b/l. Cardiovascular system: S1 & S2 +. No , rubs, gallops or clicks. B/l LE edema  Gastrointestinal system: Abdomen is obese, soft and nontender.  Hypoactive bowel sounds heard.  Central nervous system: Alert and oriented. Moves all 4 extremities  Psychiatry: Judgement and insight appear abnormal. Normal mood and affect     Data Reviewed: I have personally reviewed following labs and imaging studies  CBC: Recent Labs  Lab 04/12/19 1425  04/13/19 0621 04/16/19 0324 04/17/19 0524  WBC 9.1 6.7 5.9 5.9  NEUTROABS 8.2*  --   --   --   HGB 13.0 12.1* 12.6* 12.7*  HCT 43.9 40.3 42.0 41.6  MCV 96.5 95.7 95.7 94.3  PLT 218 191 175 161   Basic Metabolic Panel: Recent Labs  Lab 04/13/19 0621 04/14/19 0609 04/15/19 0637 04/16/19 0324 04/17/19 0524  NA 138 143 144 144 142  K 4.1 4.0 4.0 3.8 3.7  CL 102 104 103 100 98  CO2 29 32 33* 34* 35*  GLUCOSE 142* 136* 138* 130* 148*  BUN 47* 33* 30* 28* 27*  CREATININE 0.96 0.91 0.83 0.78 0.72  CALCIUM 7.7* 7.6* 7.8* 7.7* 8.1*   GFR: Estimated Creatinine Clearance: 138.6 mL/min (by C-G formula based on SCr of 0.72 mg/dL). Liver Function Tests: Recent Labs  Lab 04/12/19 1425 04/13/19 0621 04/16/19 0324 04/17/19 0524  AST 28 60* 42* 41  ALT 18 27 35 38  ALKPHOS 39 42 44 48  BILITOT 0.6 0.4 0.6 0.6  PROT 6.3* 6.3* 6.4* 6.2*  ALBUMIN 3.1* 3.0* 3.0* 2.9*   No results for input(s): LIPASE, AMYLASE in the last 168 hours. No results for input(s): AMMONIA in the last 168 hours. Coagulation Profile: Recent Labs  Lab 04/13/19 0621  INR 1.1   Cardiac Enzymes: No results for input(s): CKTOTAL, CKMB, CKMBINDEX, TROPONINI in the last 168 hours. BNP (last 3 results) No results for input(s): PROBNP in the last 8760 hours. HbA1C: No results for input(s): HGBA1C in the last 72 hours. CBG: Recent Labs  Lab 04/16/19 0750 04/16/19 1157 04/16/19 1646 04/16/19 2117 04/17/19 0734  GLUCAP 118* 166* 134* 172* 128*   Lipid Profile: No results for input(s): CHOL, HDL, LDLCALC, TRIG, CHOLHDL, LDLDIRECT in the last 72 hours. Thyroid Function Tests: No results for input(s): TSH, T4TOTAL, FREET4, T3FREE, THYROIDAB in the last 72 hours. Anemia Panel: No results for input(s): VITAMINB12, FOLATE, FERRITIN, TIBC, IRON, RETICCTPCT in the last 72 hours. Sepsis Labs: Recent Labs  Lab 04/12/19 1425 04/12/19 1626 04/13/19 0621  PROCALCITON 0.59  --  0.63  LATICACIDVEN 6.3* 2.2* 0.8      Recent Results (from the past 240 hour(s))  Culture, blood (routine x 2)     Status: None   Collection Time: 04/12/19  2:25 PM   Specimen: BLOOD  Result Value Ref Range Status   Specimen Description BLOOD BLOOD LEFT HAND  Final   Special Requests   Final    BOTTLES DRAWN AEROBIC AND ANAEROBIC Blood Culture adequate volume   Culture   Final    NO GROWTH 5 DAYS Performed at [facility], [address], [place], [place] [address]    Report Status 04/17/2019 FINAL  Final  Culture, blood (routine x 2)     Status: None   Collection Time: 04/12/19  2:30 PM   Specimen: BLOOD  Result Value Ref Range Status   Specimen Description BLOOD BLOOD RIGHT FOREARM  Final   Special Requests   Final  BOTTLES DRAWN AEROBIC AND ANAEROBIC Blood Culture adequate volume   Culture   Final    NO GROWTH 5 DAYS Performed at [facility], [address] [place], [place] [address]    Report Status 04/17/2019 FINAL  Final  Respiratory Panel by RT PCR (Flu A&B, Covid) - Nasopharyngeal Swab     Status: None   Collection Time: 04/12/19  3:46 PM   Specimen: Nasopharyngeal Swab  Result Value Ref Range Status   SARS Coronavirus 2 by RT PCR NEGATIVE NEGATIVE Final    Comment: (NOTE) SARS-CoV-2 target nucleic acids are NOT DETECTED. The SARS-CoV-2 RNA is generally detectable in upper respiratoy specimens during the acute phase of infection. The lowest concentration of SARS-CoV-2 viral copies this assay can detect is 131 copies/mL. A negative result does not preclude SARS-Cov-2 infection and should not be used as the sole basis for treatment or other patient management decisions. A negative result may occur with  improper specimen collection/handling, submission of specimen other than nasopharyngeal swab, presence of viral mutation(s) within the areas targeted by this assay, and inadequate number of viral copies (<131 copies/mL). A negative result must be combined with  clinical observations, patient history, and epidemiological information. The expected result is Negative. Fact Sheet for Patients:  PinkCheek.be Fact Sheet for Healthcare Providers:  GravelBags.it This test is not yet ap proved or cleared by the [place] FDA and  has been authorized for detection and/or diagnosis of SARS-CoV-2 by FDA under an Emergency Use Authorization (EUA). This EUA will remain  in effect (meaning this test can be used) for the duration of the COVID-19 declaration under Section 564(b)(1) of the Act, 21 U.S.C. section 360bbb-3(b)(1), unless the authorization is terminated or revoked sooner.    Influenza A by PCR NEGATIVE NEGATIVE Final   Influenza B by PCR NEGATIVE NEGATIVE Final    Comment: (NOTE) The Xpert Xpress SARS-CoV-2/FLU/RSV assay is intended as an aid in  the diagnosis of influenza from Nasopharyngeal swab specimens and  should not be used as a sole basis for treatment. Nasal washings and  aspirates are unacceptable for Xpert Xpress SARS-CoV-2/FLU/RSV  testing. Fact Sheet for Patients: PinkCheek.be Fact Sheet for Healthcare Providers: GravelBags.it This test is not yet approved or cleared by the [place] FDA and  has been authorized for detection and/or diagnosis of SARS-CoV-2 by  FDA under an Emergency Use Authorization (EUA). This EUA will remain  in effect (meaning this test can be used) for the duration of the  Covid-19 declaration under Section 564(b)(1) of the Act, 21  U.S.C. section 360bbb-3(b)(1), unless the authorization is  terminated or revoked. Performed at [facility], [address], [place], [place] [address]          Radiology Studies: No results found.      Scheduled Meds: . enoxaparin (LOVENOX) injection  40 mg Subcutaneous Q12H  . furosemide  80 mg Intravenous Q12H  . insulin aspart   0-20 Units Subcutaneous TID WC  . insulin aspart  0-5 Units Subcutaneous QHS  . sodium chloride flush  10 mL Intravenous Q12H  . sodium chloride HYPERTONIC  4 mL Nebulization BID   Continuous Infusions: . sodium chloride 10 mL/hr at 04/15/19 2125  . ceFEPime (MAXIPIME) IV 2 g (04/17/19 0629)     LOS: 5 days    Time spent: 30 mins     Wyvonnia Dusky, MD [facility] Pager [phone]  If 7PM-7AM, please contact night-coverage [email or web address] 04/17/2019, 8:22 AM

## 2019-04-17 NOTE — Care Management Important Message (Signed)
Important Message  Patient Details  Name: Scott Simpson MRN: [id number] Date of Birth: [birth year]   Medicare Important Message Given:  Yes     [name] 04/17/2019, 1:32 PM
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
## 2019-04-17 NOTE — Progress Notes (Signed)
AFFAN CALLOW   DOB:1946/02/29   PX#:[id number]    Subjective: Patient denies any significant pain.  His appetite is improving.  Is sitting up in the bed on oxygen.  He is alone.  Objective:  Vitals:   04/17/19 1119 04/17/19 1520  BP: (!) 149/77 (!) 162/83  Pulse: 76 73  Resp: 18 18  Temp: 98.2 F (36.8 C) (!) 97.5 F (36.4 C)  SpO2: 94% 93%     Intake/Output Summary (Last 24 hours) at 04/17/2019 1708 Last data filed at 04/17/2019 1300 Gross per 24 hour  Intake 840 ml  Output 1750 ml  Net -910 ml    Physical Exam  Constitutional: He is oriented to person, place, and time and well-developed, well-nourished, and in no distress.  Morbidly obese.  HENT:  Head: Normocephalic and atraumatic.  Mouth/Throat: Oropharynx is clear and moist. No oropharyngeal exudate.  Eyes: Pupils are equal, round, and reactive to light.  Cardiovascular: Normal rate and regular rhythm.  Pulmonary/Chest: No respiratory distress. He has no wheezes.  Decreased air entry bilaterally at the bases.   Abdominal: Soft. Bowel sounds are normal. He exhibits no distension and no mass. There is no abdominal tenderness. There is no rebound and no guarding.  Musculoskeletal:        General: No tenderness or edema. Normal range of motion.     Cervical back: Normal range of motion and neck supple.  Neurological: He is alert and oriented to person, place, and time.  Skin: Skin is warm.  Psychiatric: Affect normal.  Poor judgment; poor attention.     Labs:  Lab Results  Component Value Date   WBC 5.9 04/17/2019   HGB 12.7 (L) 04/17/2019   HCT 41.6 04/17/2019   MCV 94.3 04/17/2019   PLT 172 04/17/2019   NEUTROABS 8.2 (H) 04/12/2019    Lab Results  Component Value Date   NA 142 04/17/2019   K 3.7 04/17/2019   CL 98 04/17/2019   CO2 35 (H) 04/17/2019    Studies:  No results found.  Hilar lymphadenopathy #74 year old male patient with multiple medical problems is currently admitted hospital for  sepsis/question pneumonia-incidentally noted to have bulky mediastinal adenopathy/right kidney mass  # Bulky mediastinal adenopathy [incidental]-right paratracheal; right hilar lymphadenopathy -s/p biopsy positive for small cell cancer. [See discussion below].  Discussed with Dr.Aleskerov.   #Right kidney mass ~4 cm in size-enhancing; concerning for RCC.  This seems to be unrelated to the positive malignancy noted in the mediastinal lymph nodes.  #Sepsis/pneumonia-broad-spectrum antibiotics.  Improving.  #Morbid obesity/borderline performance status/history of TBI.  Recommendations/plan:  I had a long discussion the patient's daughter Levada Dy [phone] and reviewed the aggressive nature of small cell cancer.  The primary site of malignancy unclear-however statistically likely the most common site of primary.  Given current imaging patient has-limited stage disease; although PET scan might help [place] to stage more accurately.  If patient undergoes standard therapies-discussed that the median life expectancy of limited stage small cell lung cancer is anywhere between 1 to 2 years; whereas for extensive stage disease medial expectancy is 8 to 12 months.    Patient has multiple medical problems-morbid obesity/congestive heart failure/memory problems given his traumatic brain injury/performance status 3 at best at home-extremely poor candidate for therapies.  Given the patient is interested in palliative therapies-that will involve significant motivation from the patient's perspective/also travel to the cancer center multiple times in a week over many months.   After lengthy discussion it was felt that patient  at best be served with palliation of symptoms as they arise/rather than palliative radiation or chemotherapy at this time.  Recommend referral to palliative care to help make further decisions moving forward.  I will inform Josh Borders in Monday.  I also spoke to patient's wife-updated on the  pathology/seriousness of the disease.  Await palliative care evaluation on 2/15.  Discussed with Dr. Jimmye Norman.   Cammie Sickle, MD 04/17/2019  5:08 PM

## 2019-04-18 ENCOUNTER — Inpatient Hospital Stay: Payer: Medicare Other

## 2019-04-18 LAB — COMPREHENSIVE METABOLIC PANEL
ALT: 38 U/L (ref 0–44)
AST: 33 U/L (ref 15–41)
Albumin: 2.9 g/dL — ABNORMAL LOW (ref 3.5–5.0)
Alkaline Phosphatase: 49 U/L (ref 38–126)
Anion gap: 8 (ref 5–15)
BUN: 27 mg/dL — ABNORMAL HIGH (ref 8–23)
CO2: 38 mmol/L — ABNORMAL HIGH (ref 22–32)
Calcium: 8.6 mg/dL — ABNORMAL LOW (ref 8.9–10.3)
Chloride: 96 mmol/L — ABNORMAL LOW (ref 98–111)
Creatinine, Ser: 0.77 mg/dL (ref 0.61–1.24)
GFR calc Af Amer: >60 mL/min (ref >60)
GFR calc non Af Amer: >60 mL/min (ref >60)
Glucose, Bld: 161 mg/dL — ABNORMAL HIGH (ref 70–99)
Potassium: 3.6 mmol/L (ref 3.5–5.1)
Sodium: 142 mmol/L (ref 135–145)
Total Bilirubin: 0.6 mg/dL (ref 0.3–1.2)
Total Protein: 6.2 g/dL — ABNORMAL LOW (ref 6.5–8.1)

## 2019-04-18 LAB — GLUCOSE, CAPILLARY
Glucose-Capillary: 149 mg/dL — ABNORMAL HIGH (ref 70–99)
Glucose-Capillary: 189 mg/dL — ABNORMAL HIGH (ref 70–99)
Glucose-Capillary: 169 mg/dL — ABNORMAL HIGH (ref 70–99)
Glucose-Capillary: 186 mg/dL — ABNORMAL HIGH (ref 70–99)

## 2019-04-18 LAB — CBC
HCT: 44.5 % (ref 39.0–52.0)
Hemoglobin: 13.3 g/dL (ref 13.0–17.0)
MCH: 28.2 pg (ref 26.0–34.0)
MCHC: 29.9 g/dL — ABNORMAL LOW (ref 30.0–36.0)
MCV: 94.5 fL (ref 80.0–100.0)
Platelets: 172 10*3/uL (ref 150–400)
RBC: 4.71 MIL/uL (ref 4.22–5.81)
RDW: 15.9 % — ABNORMAL HIGH (ref 11.5–15.5)
WBC: 7.9 10*3/uL (ref 4.0–10.5)
nRBC: 0 % (ref 0.0–0.2)

## 2019-04-18 MED ORDER — DOCUSATE SODIUM 100 MG PO CAPS
200.0000 mg | ORAL_CAPSULE | Freq: Two times a day (BID) | ORAL | Status: DC
  Administered 2019-04-18 – 2019-04-22 (×8): 200 mg via ORAL
  Filled 2019-04-18 (×10): qty 2

## 2019-04-18 MED ORDER — BISACODYL 5 MG PO TBEC: 10.0000 mg | DELAYED_RELEASE_TABLET | Freq: Every day | ORAL | Status: DC | PRN
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
## 2019-04-18 NOTE — Progress Notes (Signed)
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  Completed abx course.  Blood cultures NGTD. Continue on IV Lasix.  Sepsis resolved.  Acute hypoxic respiratory failure: likely secondary to multilobar pneumonia +/-diffuse mediastinal lymphadenopathy.  Tested negative for COVID-19 and flu PCR.  Continue on supplemental oxygen & wean as tolerated. Will likely need oxygen at d/c. Continue on IV lasix. Monitor I/Os.   Acute on chronic diastolic CHF: continue on IV lasix. Strict I/Os & daily weights.  Echo shows EF of 65-70% with grade 1 diastolic dysfunction and severe LVH. Neg fluid balance  Mediastinal/hilar lymphadenopathy and right renal mass: Noted this admission. Suspicious for RCC with mets versus lymphoma.  LDH elevated.  CT of the abdomen pelvis again shows exophytic mass over the right upper lobe pole of the kidney. Oncology consult appreciated and plans on outpatient PET scan once acute respiratory symptoms resolved. 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 Pulmon following and recs apprec. Pt's wife & daughter decided to proceed w/ palliative care and will meet w/ palliative care on 04/20/19  Right Kidney mass: concerning for RCC. Seems to be unrelated to the small cell carcinoma as  per oncology   Hx of traumatic brain injury: does not retain any new memory at all as per pt's daughter  Elevated troponin: denies chest pain symptoms.  EKG unremarkable.  Likely secondary to demand ischemia   Essential hypertension: on low end of normal. Will continue to monitor.   Morbid obesity: BMI 48.8. Would benefit from weight loss.   AKI: resolved  Acute infectious diarrhea: no further symptoms.  Generalized weakness: PT/OT are recommending home health   DVT prophylaxis: lovenox  Code Status: full  Family Communication: called pt's daughter, Levada Dy, and answered her questions  Disposition Plan: Still requiring supplemental oxygen. PT/OT recommending home health. Home health orders placed. Pt's family will meet w/ palliative care on 04/20/19   Consultants:   Pulmon  Onco  Palliative care    Procedures:    Antimicrobials: completed abx course   Subjective: Pt states wants to go home again but I explained why he had to stay in the hospital.   Objective: Vitals:   04/17/19 1949 04/18/19 0354 04/18/19 0820 04/18/19 1352  BP: (!) 129/48 126/62 101/84 (!) 152/78  Pulse: 66 82 82 85  Resp: 19 16    Temp: (!) 97.5 F (36.4 C) 98.6 F (37 C) 97.7 F (36.5 C) (!) 97.3 F (36.3 C)  TempSrc: Oral Oral Oral Oral  SpO2: 94% 93% 91% 93%  Weight:      Height:        Intake/Output Summary (Last 24 hours) at 04/18/2019 1355 Last data filed at 04/18/2019 1130 Gross per 24 hour  Intake 480 ml  Output 5725 ml  Net -5245 ml   Filed Weights   04/13/19 0246 04/16/19 0442 04/17/19 0408  Weight: (!) 172.6 kg (!) 172.8 kg (!) 165.9 kg    Examination:  General exam: Appears calm and comfortable. Morbidly obese  Respiratory system: decreased breath sounds b/l. Cardiovascular system: S1 & S2 +. No , rubs, gallops or clicks. B/l LE edema  Gastrointestinal system: Abdomen is obese, soft and nontender.  Hypoactive bowel sounds heard.  Central nervous system: Alert and  oriented. Moves all 4 extremities  Psychiatry: Judgement and insight appear abnormal. Normal mood and affect     Data Reviewed: I have personally reviewed following labs and imaging studies  CBC: Recent Labs  Lab 04/12/19 1425 04/13/19 0621 04/16/19 0324 04/17/19 0524 04/18/19 0540  WBC 9.1 6.7 5.9 5.9 7.9  NEUTROABS 8.2*  --   --   --   --   HGB 13.0 12.1* 12.6* 12.7* 13.3  HCT 43.9 40.3 42.0 41.6 44.5  MCV 96.5 95.7 95.7 94.3 94.5  PLT 218 191 175 172 854   Basic Metabolic Panel: Recent Labs  Lab 04/14/19 0609 04/15/19 0637 04/16/19 0324 04/17/19 0524 04/18/19 0540  NA 143 144 144 142 142  K 4.0 4.0 3.8 3.7 3.6  CL 104 103 100 98 96*  CO2 32 33* 34* 35* 38*  GLUCOSE 136* 138* 130* 148* 161*  BUN 33* 30* 28* 27* 27*  CREATININE 0.91 0.83 0.78 0.72 0.77  CALCIUM 7.6* 7.8* 7.7* 8.1* 8.6*   GFR: Estimated Creatinine Clearance: 138.6 mL/min (by C-G formula based on SCr of 0.77 mg/dL). Liver Function Tests: Recent Labs  Lab 04/12/19 1425 04/13/19 0621 04/16/19 0324 04/17/19 0524 04/18/19 0540  AST 28 60* 42* 41 33  ALT 18 27 35 38 38  ALKPHOS 39 42 44 48 49  BILITOT 0.6 0.4 0.6 0.6 0.6  PROT 6.3* 6.3* 6.4* 6.2* 6.2*  ALBUMIN 3.1* 3.0* 3.0* 2.9* 2.9*   No results for input(s): LIPASE, AMYLASE in the last 168 hours. No results for input(s): AMMONIA in the last 168 hours. Coagulation Profile: Recent Labs  Lab 04/13/19 0621  INR 1.1   Cardiac Enzymes: No results for input(s): CKTOTAL, CKMB, CKMBINDEX, TROPONINI in the last 168 hours. BNP (last 3 results) No results for input(s): PROBNP in the last 8760 hours. HbA1C: No results for input(s): HGBA1C in the last 72 hours. CBG: Recent Labs  Lab 04/17/19 1120 04/17/19 1640 04/17/19 2125 04/18/19 0821 04/18/19 1150  GLUCAP 195* 208* 144* 149* 189*   Lipid Profile: No results for input(s): CHOL, HDL, LDLCALC, TRIG, CHOLHDL, LDLDIRECT in the last 72 hours. Thyroid Function Tests: No results for  input(s): TSH, T4TOTAL, FREET4, T3FREE, THYROIDAB in the last 72 hours. Anemia Panel: No results for input(s): VITAMINB12, FOLATE, FERRITIN, TIBC, IRON, RETICCTPCT in the last 72 hours. Sepsis Labs: Recent Labs  Lab 04/12/19 1425 04/12/19 1626 04/13/19 0621  PROCALCITON 0.59  --  0.63  LATICACIDVEN 6.3* 2.2* 0.8    Recent Results (from the past 240 hour(s))  Culture, blood (routine x 2)     Status: None   Collection Time: 04/12/19  2:25 PM   Specimen: BLOOD  Result Value Ref Range Status   Specimen Description BLOOD BLOOD LEFT HAND  Final   Special Requests   Final    BOTTLES DRAWN AEROBIC AND ANAEROBIC Blood Culture adequate volume   Culture   Final    NO GROWTH 5 DAYS Performed at [facility], [address], [place], [place] [address]  Report Status 04/17/2019 FINAL  Final  Culture, blood (routine x 2)     Status: None   Collection Time: 04/12/19  2:30 PM   Specimen: BLOOD  Result Value Ref Range Status   Specimen Description BLOOD BLOOD RIGHT FOREARM  Final   Special Requests   Final    BOTTLES DRAWN AEROBIC AND ANAEROBIC Blood Culture adequate volume   Culture   Final    NO GROWTH 5 DAYS Performed at [facility], [address] [place], [place] [address]    Report Status 04/17/2019 FINAL  Final  Respiratory Panel by RT PCR (Flu A&B, Covid) - Nasopharyngeal Swab     Status: None   Collection Time: 04/12/19  3:46 PM   Specimen: Nasopharyngeal Swab  Result Value Ref Range Status   SARS Coronavirus 2 by RT PCR NEGATIVE NEGATIVE Final    Comment: (NOTE) SARS-CoV-2 target nucleic acids are NOT DETECTED. The SARS-CoV-2 RNA is generally detectable in upper respiratoy specimens during the acute phase of infection. The lowest concentration of SARS-CoV-2 viral copies this assay can detect is 131 copies/mL. A negative result does not preclude SARS-Cov-2 infection and should not be used as the sole basis for treatment or other patient management  decisions. A negative result may occur with  improper specimen collection/handling, submission of specimen other than nasopharyngeal swab, presence of viral mutation(s) within the areas targeted by this assay, and inadequate number of viral copies (<131 copies/mL). A negative result must be combined with clinical observations, patient history, and epidemiological information. The expected result is Negative. Fact Sheet for Patients:  PinkCheek.be Fact Sheet for Healthcare Providers:  GravelBags.it This test is not yet ap proved or cleared by the [place] FDA and  has been authorized for detection and/or diagnosis of SARS-CoV-2 by FDA under an Emergency Use Authorization (EUA). This EUA will remain  in effect (meaning this test can be used) for the duration of the COVID-19 declaration under Section 564(b)(1) of the Act, 21 U.S.C. section 360bbb-3(b)(1), unless the authorization is terminated or revoked sooner.    Influenza A by PCR NEGATIVE NEGATIVE Final   Influenza B by PCR NEGATIVE NEGATIVE Final    Comment: (NOTE) The Xpert Xpress SARS-CoV-2/FLU/RSV assay is intended as an aid in  the diagnosis of influenza from Nasopharyngeal swab specimens and  should not be used as a sole basis for treatment. Nasal washings and  aspirates are unacceptable for Xpert Xpress SARS-CoV-2/FLU/RSV  testing. Fact Sheet for Patients: PinkCheek.be Fact Sheet for Healthcare Providers: GravelBags.it This test is not yet approved or cleared by the [place] FDA and  has been authorized for detection and/or diagnosis of SARS-CoV-2 by  FDA under an Emergency Use Authorization (EUA). This EUA will remain  in effect (meaning this test can be used) for the duration of the  Covid-19 declaration under Section 564(b)(1) of the Act, 21  U.S.C. section 360bbb-3(b)(1), unless the authorization  is  terminated or revoked. Performed at [facility], [address] [place], [place] [address]          Radiology Studies: [facility] Chest [place] 1 View  Result Date: 04/18/2019 CLINICAL DATA:  Short of breath. Diabetes and hypertension. EXAM: PORTABLE CHEST 1 VIEW COMPARISON:  04/12/2019 FINDINGS: Stable cardiac enlargement. Small right pleural effusion. Pulmonary vascular congestion. Right base atelectasis. Unchanged IMPRESSION: Stable exam. Electronically Signed   By: Kerby Moors M.D.   On: 04/18/2019 10:10        Scheduled Meds: . docusate sodium  200 mg Oral BID  . enoxaparin (LOVENOX) injection  40 mg Subcutaneous Q12H  . insulin aspart  0-20 Units Subcutaneous TID WC  . insulin aspart  0-5 Units Subcutaneous QHS  . sodium chloride flush  10 mL Intravenous Q12H   Continuous Infusions: . sodium chloride Stopped (04/18/19 0648)     LOS: 6 days    Time spent: 30 mins     Wyvonnia Dusky, MD [facility] Pager [phone]  If 7PM-7AM, please contact night-coverage [email or web address] 04/18/2019, 1:55 PM

## 2019-04-19 DIAGNOSIS — C3490 Malignant neoplasm of unspecified part of unspecified bronchus or lung: Secondary | ICD-10-CM

## 2019-04-19 LAB — COMPREHENSIVE METABOLIC PANEL
ALT: 36 U/L (ref 0–44)
AST: 27 U/L (ref 15–41)
Albumin: 3.2 g/dL — ABNORMAL LOW (ref 3.5–5.0)
Alkaline Phosphatase: 53 U/L (ref 38–126)
Anion gap: 9 (ref 5–15)
BUN: 31 mg/dL — ABNORMAL HIGH (ref 8–23)
CO2: 37 mmol/L — ABNORMAL HIGH (ref 22–32)
Calcium: 8.8 mg/dL — ABNORMAL LOW (ref 8.9–10.3)
Chloride: 95 mmol/L — ABNORMAL LOW (ref 98–111)
Creatinine, Ser: 0.74 mg/dL (ref 0.61–1.24)
GFR calc Af Amer: >60 mL/min (ref >60)
GFR calc non Af Amer: >60 mL/min (ref >60)
Glucose, Bld: 168 mg/dL — ABNORMAL HIGH (ref 70–99)
Potassium: 3.9 mmol/L (ref 3.5–5.1)
Sodium: 141 mmol/L (ref 135–145)
Total Bilirubin: 0.7 mg/dL (ref 0.3–1.2)
Total Protein: 7 g/dL (ref 6.5–8.1)

## 2019-04-19 LAB — CBC
HCT: 43.5 % (ref 39.0–52.0)
Hemoglobin: 13.2 g/dL (ref 13.0–17.0)
MCH: 28.6 pg (ref 26.0–34.0)
MCHC: 30.3 g/dL (ref 30.0–36.0)
MCV: 94.2 fL (ref 80.0–100.0)
Platelets: 185 10*3/uL (ref 150–400)
RBC: 4.62 MIL/uL (ref 4.22–5.81)
RDW: 15.9 % — ABNORMAL HIGH (ref 11.5–15.5)
WBC: 8.4 10*3/uL (ref 4.0–10.5)
nRBC: 0 % (ref 0.0–0.2)

## 2019-04-19 LAB — GLUCOSE, CAPILLARY
Glucose-Capillary: 145 mg/dL — ABNORMAL HIGH (ref 70–99)
Glucose-Capillary: 191 mg/dL — ABNORMAL HIGH (ref 70–99)
Glucose-Capillary: 249 mg/dL — ABNORMAL HIGH (ref 70–99)
Glucose-Capillary: 219 mg/dL — ABNORMAL HIGH (ref 70–99)

## 2019-04-19 NOTE — Plan of Care (Signed)
  Problem: Education: Goal: Knowledge of General Education information will improve Description: Including pain rating scale, medication(s)/side effects and non-pharmacologic comfort measures Outcome: Not Progressing Note: Patient had a T.B.I. many years ago, short-term memory very poor. Has been admitted a week now. Palliative care meeting with family member(s) tomorrow. Will continue to monitor respiratory status for the remainder of the shift. Wenda Low [facility]

## 2019-04-19 NOTE — Progress Notes (Signed)
SATURATION QUALIFICATIONS: (This note is used to comply with regulatory documentation for home oxygen)  Patient Saturations on Room Air at Rest = 86%  Patient Saturations on Room Air while Ambulating = N/A%  Patient Saturations on N/A Liters of oxygen while Ambulating = N/A%  Please briefly explain why patient needs home oxygen: X Daron Offer
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
## 2019-04-19 NOTE — Progress Notes (Signed)
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  Completed abx course.  Blood cultures NGTD. Continue on IV Lasix.  Sepsis resolved.  Acute hypoxic respiratory failure: likely secondary to multilobar pneumonia +/-diffuse mediastinal lymphadenopathy.  Tested negative for COVID-19 and flu PCR.  Continue on supplemental oxygen & wean as tolerated. Will likely need oxygen at d/c. Continue on IV lasix. Monitor I/Os.   Acute on chronic diastolic CHF: continue on IV lasix. Strict I/Os. Echo shows EF of 65-70% with grade 1 diastolic dysfunction and severe LVH. Neg fluid balance  Mediastinal/hilar lymphadenopathy and right renal mass: Noted this admission.  LDH elevated. CT of the abdomen pelvis again shows exophytic mass over the right upper lobe pole of the kidney.  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 Pulmon, Onco following and recs apprec. Pt's wife & daughter decided to proceed w/ palliative care and will meet w/ palliative care on 04/20/19  Right Kidney mass: concerning for RCC. Seems to be unrelated to the small cell carcinoma as per oncology   Hx of traumatic brain injury: does not retain any new memory at all as per pt's daughter  Elevated troponin: denies chest pain symptoms.   EKG unremarkable.  Likely secondary to demand ischemia   Essential hypertension: on low end of normal. Will continue to monitor.   Morbid obesity: BMI 48.8. Would benefit from weight loss.   AKI: resolved  Acute infectious diarrhea: no further symptoms.  Generalized weakness: PT/OT are recommending home health. Home health orders placed today  DVT prophylaxis: lovenox  Code Status: full  Family Communication: called pt's daughter, Levada Dy, and answered her questions  Disposition Plan: Still requiring supplemental oxygen & will likely oxygen to go home with. PT/OT recommending home health. Home health orders placed today. Pt's family will meet w/ palliative care on 04/20/19   Consultants:   Pulmon  Onco  Palliative care    Procedures:    Antimicrobials: completed abx course   Subjective: Pt c/o fatigue.   Objective: Vitals:   04/18/19 1931 04/18/19 1944 04/19/19 0536 04/19/19 0729  BP: (!) 156/89  (!) 116/40 (!) 166/59  Pulse: 83  78 78  Resp: 16  16 17   Temp: (!) 97.5 F (36.4 C)  98.2 F (36.8 C) 97.6 F (36.4 C)  TempSrc: Oral  Oral Oral  SpO2: 93% 92% 90% 91%  Weight:   (!) 163.4 kg   Height:        Intake/Output Summary (Last 24 hours) at 04/19/2019 0802 Last data filed at 04/19/2019 0439 Gross per 24 hour  Intake 1522.95 ml  Output 3800 ml  Net -2277.05 ml   Filed Weights   04/16/19 0442 04/17/19 0408 04/19/19 0536  Weight: (!) 172.8 kg (!) 165.9 kg (!) 163.4 kg  Examination:  General exam: Appears calm and comfortable. Morbidly obese  Respiratory system: diminished breath sounds b/l. No rales Cardiovascular system: S1 & S2 +. No , rubs, gallops or clicks. B/l LE edema  Gastrointestinal system: Abdomen is obese, soft and nontender.  Hypoactive bowel sounds heard.  Central nervous system: Alert and oriented. Moves all 4 extremities  Psychiatry: Judgement and insight appear abnormal. Normal mood and affect     Data Reviewed: I have  personally reviewed following labs and imaging studies  CBC: Recent Labs  Lab 04/12/19 1425 04/12/19 1425 04/13/19 0621 04/16/19 0324 04/17/19 0524 04/18/19 0540 04/19/19 0335  WBC 9.1   < > 6.7 5.9 5.9 7.9 8.4  NEUTROABS 8.2*  --   --   --   --   --   --   HGB 13.0   < > 12.1* 12.6* 12.7* 13.3 13.2  HCT 43.9   < > 40.3 42.0 41.6 44.5 43.5  MCV 96.5   < > 95.7 95.7 94.3 94.5 94.2  PLT 218   < > 191 175 172 172 185   < > = values in this interval not displayed.   Basic Metabolic Panel: Recent Labs  Lab 04/15/19 0637 04/16/19 0324 04/17/19 0524 04/18/19 0540 04/19/19 0335  NA 144 144 142 142 141  K 4.0 3.8 3.7 3.6 3.9  CL 103 100 98 96* 95*  CO2 33* 34* 35* 38* 37*  GLUCOSE 138* 130* 148* 161* 168*  BUN 30* 28* 27* 27* 31*  CREATININE 0.83 0.78 0.72 0.77 0.74  CALCIUM 7.8* 7.7* 8.1* 8.6* 8.8*   GFR: Estimated Creatinine Clearance: 137.4 mL/min (by C-G formula based on SCr of 0.74 mg/dL). Liver Function Tests: Recent Labs  Lab 04/13/19 0621 04/16/19 0324 04/17/19 0524 04/18/19 0540 04/19/19 0335  AST 60* 42* 41 33 27  ALT 27 35 38 38 36  ALKPHOS 42 44 48 49 53  BILITOT 0.4 0.6 0.6 0.6 0.7  PROT 6.3* 6.4* 6.2* 6.2* 7.0  ALBUMIN 3.0* 3.0* 2.9* 2.9* 3.2*   No results for input(s): LIPASE, AMYLASE in the last 168 hours. No results for input(s): AMMONIA in the last 168 hours. Coagulation Profile: Recent Labs  Lab 04/13/19 0621  INR 1.1   Cardiac Enzymes: No results for input(s): CKTOTAL, CKMB, CKMBINDEX, TROPONINI in the last 168 hours. BNP (last 3 results) No results for input(s): PROBNP in the last 8760 hours. HbA1C: No results for input(s): HGBA1C in the last 72 hours. CBG: Recent Labs  Lab 04/18/19 0821 04/18/19 1150 04/18/19 1729 04/18/19 2113 04/19/19 0729  GLUCAP 149* 189* 169* 186* 145*   Lipid Profile: No results for input(s): CHOL, HDL, LDLCALC, TRIG, CHOLHDL, LDLDIRECT in the last 72 hours. Thyroid Function Tests: No results for  input(s): TSH, T4TOTAL, FREET4, T3FREE, THYROIDAB in the last 72 hours. Anemia Panel: No results for input(s): VITAMINB12, FOLATE, FERRITIN, TIBC, IRON, RETICCTPCT in the last 72 hours. Sepsis Labs: Recent Labs  Lab 04/12/19 1425 04/12/19 1626 04/13/19 0621  PROCALCITON 0.59  --  0.63  LATICACIDVEN 6.3* 2.2* 0.8    Recent Results (from the past 240 hour(s))  Culture, blood (routine x 2)     Status: None   Collection Time: 04/12/19  2:25 PM   Specimen: BLOOD  Result Value Ref Range Status   Specimen Description BLOOD BLOOD LEFT HAND  Final   Special Requests   Final    BOTTLES DRAWN AEROBIC AND ANAEROBIC Blood Culture adequate volume   Culture   Final  NO GROWTH 5 DAYS Performed at [facility], [address] [place], [place] [address]    Report Status 04/17/2019 FINAL  Final  Culture, blood (routine x 2)     Status: None   Collection Time: 04/12/19  2:30 PM   Specimen: BLOOD  Result Value Ref Range Status   Specimen Description BLOOD BLOOD RIGHT FOREARM  Final   Special Requests   Final    BOTTLES DRAWN AEROBIC AND ANAEROBIC Blood Culture adequate volume   Culture   Final    NO GROWTH 5 DAYS Performed at [facility], [address] [place], [place] [address]    Report Status 04/17/2019 FINAL  Final  Respiratory Panel by RT PCR (Flu A&B, Covid) - Nasopharyngeal Swab     Status: None   Collection Time: 04/12/19  3:46 PM   Specimen: Nasopharyngeal Swab  Result Value Ref Range Status   SARS Coronavirus 2 by RT PCR NEGATIVE NEGATIVE Final    Comment: (NOTE) SARS-CoV-2 target nucleic acids are NOT DETECTED. The SARS-CoV-2 RNA is generally detectable in upper respiratoy specimens during the acute phase of infection. The lowest concentration of SARS-CoV-2 viral copies this assay can detect is 131 copies/mL. A negative result does not preclude SARS-Cov-2 infection and should not be used as the sole basis for treatment or other patient management  decisions. A negative result may occur with  improper specimen collection/handling, submission of specimen other than nasopharyngeal swab, presence of viral mutation(s) within the areas targeted by this assay, and inadequate number of viral copies (<131 copies/mL). A negative result must be combined with clinical observations, patient history, and epidemiological information. The expected result is Negative. Fact Sheet for Patients:  PinkCheek.be Fact Sheet for Healthcare Providers:  GravelBags.it This test is not yet ap proved or cleared by the [place] FDA and  has been authorized for detection and/or diagnosis of SARS-CoV-2 by FDA under an Emergency Use Authorization (EUA). This EUA will remain  in effect (meaning this test can be used) for the duration of the COVID-19 declaration under Section 564(b)(1) of the Act, 21 U.S.C. section 360bbb-3(b)(1), unless the authorization is terminated or revoked sooner.    Influenza A by PCR NEGATIVE NEGATIVE Final   Influenza B by PCR NEGATIVE NEGATIVE Final    Comment: (NOTE) The Xpert Xpress SARS-CoV-2/FLU/RSV assay is intended as an aid in  the diagnosis of influenza from Nasopharyngeal swab specimens and  should not be used as a sole basis for treatment. Nasal washings and  aspirates are unacceptable for Xpert Xpress SARS-CoV-2/FLU/RSV  testing. Fact Sheet for Patients: PinkCheek.be Fact Sheet for Healthcare Providers: GravelBags.it This test is not yet approved or cleared by the [place] FDA and  has been authorized for detection and/or diagnosis of SARS-CoV-2 by  FDA under an Emergency Use Authorization (EUA). This EUA will remain  in effect (meaning this test can be used) for the duration of the  Covid-19 declaration under Section 564(b)(1) of the Act, 21  U.S.C. section 360bbb-3(b)(1), unless the authorization  is  terminated or revoked. Performed at [facility], [address], [place], [place] [address]          Radiology Studies: [facility] Chest [place] 1 View  Result Date: 04/18/2019 CLINICAL DATA:  Short of breath. Diabetes and hypertension. EXAM: PORTABLE CHEST 1 VIEW COMPARISON:  04/12/2019 FINDINGS: Stable cardiac enlargement. Small right pleural effusion. Pulmonary vascular congestion. Right base atelectasis. Unchanged IMPRESSION: Stable exam. Electronically Signed   By: Lovena Le  Clovis Riley M.D.   On: 04/18/2019 10:10        Scheduled Meds: . docusate sodium  200 mg Oral BID  . enoxaparin (LOVENOX) injection  40 mg Subcutaneous Q12H  . insulin aspart  0-20 Units Subcutaneous TID WC  . insulin aspart  0-5 Units Subcutaneous QHS  . sodium chloride flush  10 mL Intravenous Q12H   Continuous Infusions: . sodium chloride Stopped (04/18/19 0643)     LOS: 7 days    Time spent: 30 mins     Wyvonnia Dusky, MD [facility] Pager [phone]  If 7PM-7AM, please contact night-coverage [email or web address] 04/19/2019, 8:02 AM

## 2019-04-20 ENCOUNTER — Telehealth: Payer: Self-pay | Admitting: Family Medicine

## 2019-04-20 ENCOUNTER — Inpatient Hospital Stay: Payer: Medicare Other

## 2019-04-20 ENCOUNTER — Encounter: Payer: Self-pay | Admitting: Internal Medicine

## 2019-04-20 DIAGNOSIS — Z515 Encounter for palliative care: Secondary | ICD-10-CM

## 2019-04-20 DIAGNOSIS — A419 Sepsis, unspecified organism: Principal | ICD-10-CM

## 2019-04-20 DIAGNOSIS — R6520 Severe sepsis without septic shock: Secondary | ICD-10-CM

## 2019-04-20 DIAGNOSIS — J9601 Acute respiratory failure with hypoxia: Secondary | ICD-10-CM

## 2019-04-20 LAB — COMPREHENSIVE METABOLIC PANEL
ALT: 35 U/L (ref 0–44)
AST: 24 U/L (ref 15–41)
Albumin: 3.1 g/dL — ABNORMAL LOW (ref 3.5–5.0)
Alkaline Phosphatase: 54 U/L (ref 38–126)
Anion gap: 9 (ref 5–15)
BUN: 28 mg/dL — ABNORMAL HIGH (ref 8–23)
CO2: 37 mmol/L — ABNORMAL HIGH (ref 22–32)
Calcium: 9.1 mg/dL (ref 8.9–10.3)
Chloride: 94 mmol/L — ABNORMAL LOW (ref 98–111)
Creatinine, Ser: 0.6 mg/dL — ABNORMAL LOW (ref 0.61–1.24)
GFR calc Af Amer: >60 mL/min (ref >60)
GFR calc non Af Amer: >60 mL/min (ref >60)
Glucose, Bld: 167 mg/dL — ABNORMAL HIGH (ref 70–99)
Potassium: 4 mmol/L (ref 3.5–5.1)
Sodium: 140 mmol/L (ref 135–145)
Total Bilirubin: 0.5 mg/dL (ref 0.3–1.2)
Total Protein: 6.8 g/dL (ref 6.5–8.1)

## 2019-04-20 LAB — CBC
HCT: 44.6 % (ref 39.0–52.0)
Hemoglobin: 13.4 g/dL (ref 13.0–17.0)
MCH: 28.3 pg (ref 26.0–34.0)
MCHC: 30 g/dL (ref 30.0–36.0)
MCV: 94.1 fL (ref 80.0–100.0)
Platelets: 192 10*3/uL (ref 150–400)
RBC: 4.74 MIL/uL (ref 4.22–5.81)
RDW: 15.7 % — ABNORMAL HIGH (ref 11.5–15.5)
WBC: 7.4 10*3/uL (ref 4.0–10.5)
nRBC: 0 % (ref 0.0–0.2)

## 2019-04-20 LAB — GLUCOSE, CAPILLARY
Glucose-Capillary: 157 mg/dL — ABNORMAL HIGH (ref 70–99)
Glucose-Capillary: 251 mg/dL — ABNORMAL HIGH (ref 70–99)
Glucose-Capillary: 224 mg/dL — ABNORMAL HIGH (ref 70–99)
Glucose-Capillary: 239 mg/dL — ABNORMAL HIGH (ref 70–99)
Glucose-Capillary: 231 mg/dL — ABNORMAL HIGH (ref 70–99)

## 2019-04-20 MED ORDER — IOHEXOL 300 MG/ML  SOLN
75.0000 mL | Freq: Once | INTRAMUSCULAR | Status: AC | PRN
  Administered 2019-04-20: 75 mL via INTRAVENOUS

## 2019-04-20 MED ORDER — HYDRALAZINE HCL 50 MG PO TABS
50.0000 mg | ORAL_TABLET | Freq: Four times a day (QID) | ORAL | Status: DC | PRN
  Administered 2019-04-20: 50 mg via ORAL
  Filled 2019-04-20: qty 1
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
## 2019-04-20 NOTE — Telephone Encounter (Signed)
Patient's wife Vaughan Basta advised and verbalized understanding. FMLA paperwork placed back up front for pickup

## 2019-04-20 NOTE — Progress Notes (Signed)
MD notified of patient's elevated blood pressure readings (systolic in the 045V).  New orders put in by MD.

## 2019-04-20 NOTE — Telephone Encounter (Signed)
They need to take those forms to review with Dr. Rogue Bussing.They have an appointment with him on 04-23-2019.  I don't know the information required to complete them.

## 2019-04-20 NOTE — Progress Notes (Signed)
Dr. Rogue Bussing and I met with patient's wife.  Both of patient's daughters participated in the visit via phone.  Together, we discussed patient's current medical problems.  Family recognize that patient has an aggressive malignancy that will likely result in his eventual end-of-life.  Given patient's other comorbidities, family is reluctant to consider treatment.  They stated they would ultimately prefer to focus on his comfort.  Family is torn on whether to take him home with either home health or hospice versus sending him to rehab.  They plan to consult with patient regarding his goals but feel that he likely will want to go home.  Family did ask for consideration for a CT of the head.  This would be reasonable to rule out brain mets and complete staging work-up.  We discussed CODE STATUS.  Daughter who is a paramedic did not feel like resuscitation would be in patient's best interest but family intend to speak more privately regarding decisions.  Family request that they be able to meet with patient together (wife and daughter) to clarify decision-making.  I did speak with nursing staff who approved this visit.  Plan: -Continue current scope of treatment -Head CT with and without contrast -Family are considering decisions for disposition and CODE STATUS -We will follow  Time Total: 30 minutes  Visit consisted of counseling and education dealing with the complex and emotionally intense issues of symptom management and palliative care in the setting of serious and potentially life-threatening illness.Greater than 50%  of this time was spent counseling and coordinating care related to the above assessment and plan.  Signed by: Altha Harm, PhD, NP-C

## 2019-04-20 NOTE — Care Management Important Message (Signed)
Important Message  Patient Details  Name: Scott Simpson MRN: [id number] Date of Birth: Nov 14, 1945   Medicare Important Message Given:  Yes     [name] 04/20/2019, 11:49 AM

## 2019-04-20 NOTE — Progress Notes (Signed)
ALTER MOSS   DOB:1945-09-16   PV#:[id number]    Subjective: Patient resting in the bed no acute distress.  No acute events overnight.  Is alone.  Objective:  Vitals:   04/20/19 1618 04/20/19 1750  BP: (!) 166/85 (!) 153/77  Pulse: 81 95  Resp: 16   Temp: 98.3 F (36.8 C)   SpO2: 93%      Intake/Output Summary (Last 24 hours) at 04/20/2019 1926 Last data filed at 04/20/2019 1025 Gross per 24 hour  Intake 3 ml  Output 650 ml  Net -647 ml    Physical Exam  Constitutional: He is oriented to person, place, and time and well-developed, well-nourished, and in no distress.  Morbidly obese.  HENT:  Head: Normocephalic and atraumatic.  Mouth/Throat: Oropharynx is clear and moist. No oropharyngeal exudate.  Eyes: Pupils are equal, round, and reactive to light.  Cardiovascular: Normal rate and regular rhythm.  Pulmonary/Chest: No respiratory distress. He has no wheezes.  Decreased air entry bilaterally at the bases.   Abdominal: Soft. Bowel sounds are normal. He exhibits no distension and no mass. There is no abdominal tenderness. There is no rebound and no guarding.  Musculoskeletal:        General: No tenderness or edema. Normal range of motion.     Cervical back: Normal range of motion and neck supple.  Neurological: He is alert and oriented to person, place, and time.  Skin: Skin is warm.  Psychiatric: Affect normal.  Poor judgment; poor attention.     Labs:  Lab Results  Component Value Date   WBC 7.4 04/20/2019   HGB 13.4 04/20/2019   HCT 44.6 04/20/2019   MCV 94.1 04/20/2019   PLT 192 04/20/2019   NEUTROABS 8.2 (H) 04/12/2019    Lab Results  Component Value Date   NA 140 04/20/2019   K 4.0 04/20/2019   CL 94 (L) 04/20/2019   CO2 37 (H) 04/20/2019    Studies:  No results found.  Hilar lymphadenopathy #74 year old male patient with multiple medical problems is currently admitted hospital for sepsis/question pneumonia-incidentally noted to have bulky  mediastinal adenopathy/right kidney mass  # Bulky mediastinal adenopathy [incidental]-right paratracheal; right hilar lymphadenopathy -s/p biopsy positive for small cell cancer. [See discussion below].   #Right kidney mass ~4 cm in size-enhancing; concerning for RCC.  This seems to be unrelated to the positive malignancy noted in the mediastinal lymph nodes.  #Sepsis/pneumonia-broad-spectrum antibiotics.  Improving.  #Morbid obesity/borderline performance status/history of TBI.  #Debility secondary to hospitalization-question rehab.   Recommendations/plan:  Long discussion with the patient's wife Bettina Gavia and Lattie Haw over the phone]-regarding the pathology/and overall aggressive nature of small cell malignancy.  Patient's likely source is lung.  Discussed the futility of PET scan-in decision-making process-both treatment and prognostication; hence would not recommend a PET scan.  #I would recommend best supportive care at this time.  I would not recommend any chemotherapy or radiation therapy given patient's multiple acute problems/the lack of significant benefit from the treatments.  During the meeting pros and cons of hospice at home vs.rehab at nursing home in conjunction with palliative care-was discussed at length in the context of Covid pandemic.  As per family's request-I think it is reasonable to order CT scan brain with contrast mostly for prognostication purposes.  Updated Dr. Jimmye Norman.   # 40 minutes face-to-face with the patient/family discussing the above plan of care; more than 50% of time spent on prognosis/ natural history; counseling and coordination.  Cammie Sickle, MD 04/20/2019  7:26 PM

## 2019-04-20 NOTE — Plan of Care (Signed)
  Problem: Education: Goal: Knowledge of General Education information will improve Description: Including pain rating scale, medication(s)/side effects and non-pharmacologic comfort measures Outcome: Progressing   Problem: Clinical Measurements: Goal: Respiratory complications will improve Outcome: Progressing Note: Down to 3L of oxygen from 4L this am. Tolerating 3L well.    Problem: Activity: Goal: Risk for activity intolerance will decrease Outcome: Progressing Note: Patient up to the chair and BSC.    Problem: Safety: Goal: Ability to remain free from injury will improve Outcome: Progressing
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
## 2019-04-20 NOTE — Progress Notes (Signed)
Palliative NP Scott Simpson informed this RN that they had a family meeting regarding goals of care for this patient. Requesting if daughter Scott Simpson, who is an EMT, can come up to bedside along with the wife to discuss goals of care. Dr. Jimmye Norman paged to obtain permission. Per MD ok for both daughter and wife to visit patient to discuss goals of care.

## 2019-04-20 NOTE — Telephone Encounter (Signed)
Scott Simpson dropped off FMLA papers to be completed. Scott Simpson has been diagnosed with terminal small cell lung CA and treatment is not an option. Please let Vaughan Basta know when forms are ready and please fax to [phone] which is the HR department.

## 2019-04-20 NOTE — Progress Notes (Signed)
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  Completed abx course.  Blood cultures NGTD. Continue on IV Lasix.  Sepsis resolved.  Acute hypoxic respiratory failure: likely secondary to multilobar pneumonia +/-diffuse mediastinal lymphadenopathy.  Tested negative for COVID-19 and flu PCR.  Continue on supplemental oxygen & wean as tolerated. Will likely need oxygen at d/c. Currently on 3L [place]. Continue on IV lasix. Monitor I/Os.   Acute on chronic diastolic CHF: continue on IV lasix. Strict I/Os. Echo shows EF of 65-70% with grade 1 diastolic dysfunction and severe LVH. Neg fluid balance  Mediastinal/hilar lymphadenopathy and right renal mass: Noted this admission.  LDH elevated. CT of the abdomen pelvis again shows exophytic mass over the right upper lobe pole of the kidney.  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 Pulmon, Onco following and recs apprec. Pt's wife & daughter decided to proceed w/ palliative care and will meet w/ palliative care today.   Right Kidney mass: concerning for RCC. Seems to be unrelated to the small cell carcinoma as per oncology   Hx of traumatic brain injury: does not retain any new memory at all as per pt's  daughter  Elevated troponin: denies chest pain symptoms.  EKG unremarkable.  Likely secondary to demand ischemia   Essential hypertension: on low end of normal. Will continue to monitor.   Morbid obesity: BMI 48.8. Would benefit from weight loss.   AKI: resolved  Acute infectious diarrhea: no further symptoms.  Generalized weakness: OT are recommending home health. PT re-eval recommending SNF   DVT prophylaxis: lovenox  Code Status: full  Family Communication:  Disposition Plan: Still requiring supplemental oxygen & will likely need oxygen to go home with. PT re-eval recommending SNF. Palliative care will meet w/ the family today  Consultants:   Pulmon  Onco  Palliative care    Procedures:    Antimicrobials: completed abx course   Subjective: Pt c/o malaise and wants to go home  Objective: Vitals:   04/19/19 1131 04/19/19 1606 04/19/19 1954 04/20/19 0509  BP: (!) 144/71 (!) 158/81 (!) 105/95 (!) 142/63  Pulse: 73 88 87 83  Resp: 18 16 20 20   Temp: 97.9 F (36.6 C) 97.9 F (36.6 C) 98 F (36.7 C) 98.7 F (37.1 C)  TempSrc: Oral Oral Oral Oral  SpO2: 91% 92% 100% 93%  Weight:    (!) 163.8 kg  Height:        Intake/Output Summary (Last 24 hours) at 04/20/2019 0808 Last data filed at 04/20/2019 0518 Gross per 24 hour  Intake 720 ml  Output 1250 ml  Net -530 ml   Filed Weights   04/17/19 0408 04/19/19 0536 04/20/19 0509  Weight: (!) 165.9 kg Marland Kitchen)  163.4 kg (!) 163.8 kg    Examination:  General exam: Appears calm and comfortable. Morbidly obese  Respiratory system: decreased breath sounds b/l. No rales Cardiovascular system: S1 & S2 +. No , rubs, gallops or clicks. B/l LE edema  Gastrointestinal system: Abdomen is obese, soft and nontender.  Normal bowel sounds heard.  Central nervous system: Alert and oriented. Moves all 4 extremities  Psychiatry: Judgement and insight appear abnormal. Flat mood and affect     Data Reviewed: I have personally  reviewed following labs and imaging studies  CBC: Recent Labs  Lab 04/16/19 0324 04/17/19 0524 04/18/19 0540 04/19/19 0335 04/20/19 0523  WBC 5.9 5.9 7.9 8.4 7.4  HGB 12.6* 12.7* 13.3 13.2 13.4  HCT 42.0 41.6 44.5 43.5 44.6  MCV 95.7 94.3 94.5 94.2 94.1  PLT 175 172 172 185 878   Basic Metabolic Panel: Recent Labs  Lab 04/16/19 0324 04/17/19 0524 04/18/19 0540 04/19/19 0335 04/20/19 0523  NA 144 142 142 141 140  K 3.8 3.7 3.6 3.9 4.0  CL 100 98 96* 95* 94*  CO2 34* 35* 38* 37* 37*  GLUCOSE 130* 148* 161* 168* 167*  BUN 28* 27* 27* 31* 28*  CREATININE 0.78 0.72 0.77 0.74 0.60*  CALCIUM 7.7* 8.1* 8.6* 8.8* 9.1   GFR: Estimated Creatinine Clearance: 137.5 mL/min (A) (by C-G formula based on SCr of 0.6 mg/dL (L)). Liver Function Tests: Recent Labs  Lab 04/16/19 0324 04/17/19 0524 04/18/19 0540 04/19/19 0335 04/20/19 0523  AST 42* 41 33 27 24  ALT 35 38 38 36 35  ALKPHOS 44 48 49 53 54  BILITOT 0.6 0.6 0.6 0.7 0.5  PROT 6.4* 6.2* 6.2* 7.0 6.8  ALBUMIN 3.0* 2.9* 2.9* 3.2* 3.1*   No results for input(s): LIPASE, AMYLASE in the last 168 hours. No results for input(s): AMMONIA in the last 168 hours. Coagulation Profile: No results for input(s): INR, PROTIME in the last 168 hours. Cardiac Enzymes: No results for input(s): CKTOTAL, CKMB, CKMBINDEX, TROPONINI in the last 168 hours. BNP (last 3 results) No results for input(s): PROBNP in the last 8760 hours. HbA1C: No results for input(s): HGBA1C in the last 72 hours. CBG: Recent Labs  Lab 04/18/19 2113 04/19/19 0729 04/19/19 1131 04/19/19 1721 04/19/19 2055  GLUCAP 186* 145* 191* 249* 219*   Lipid Profile: No results for input(s): CHOL, HDL, LDLCALC, TRIG, CHOLHDL, LDLDIRECT in the last 72 hours. Thyroid Function Tests: No results for input(s): TSH, T4TOTAL, FREET4, T3FREE, THYROIDAB in the last 72 hours. Anemia Panel: No results for input(s): VITAMINB12, FOLATE, FERRITIN, TIBC, IRON, RETICCTPCT in the  last 72 hours. Sepsis Labs: No results for input(s): PROCALCITON, LATICACIDVEN in the last 168 hours.  Recent Results (from the past 240 hour(s))  Culture, blood (routine x 2)     Status: None   Collection Time: 04/12/19  2:25 PM   Specimen: BLOOD  Result Value Ref Range Status   Specimen Description BLOOD BLOOD LEFT HAND  Final   Special Requests   Final    BOTTLES DRAWN AEROBIC AND ANAEROBIC Blood Culture adequate volume   Culture   Final    NO GROWTH 5 DAYS Performed at [facility], [address] [place], [place] [address]    Report Status 04/17/2019 FINAL  Final  Culture, blood (routine x 2)     Status: None   Collection Time: 04/12/19  2:30 PM   Specimen: BLOOD  Result Value Ref Range Status   Specimen Description BLOOD BLOOD  RIGHT FOREARM  Final   Special Requests   Final    BOTTLES DRAWN AEROBIC AND ANAEROBIC Blood Culture adequate volume   Culture   Final    NO GROWTH 5 DAYS Performed at [facility], [address] [place], [place] [address]    Report Status 04/17/2019 FINAL  Final  Respiratory Panel by RT PCR (Flu A&B, Covid) - Nasopharyngeal Swab     Status: None   Collection Time: 04/12/19  3:46 PM   Specimen: Nasopharyngeal Swab  Result Value Ref Range Status   SARS Coronavirus 2 by RT PCR NEGATIVE NEGATIVE Final    Comment: (NOTE) SARS-CoV-2 target nucleic acids are NOT DETECTED. The SARS-CoV-2 RNA is generally detectable in upper respiratoy specimens during the acute phase of infection. The lowest concentration of SARS-CoV-2 viral copies this assay can detect is 131 copies/mL. A negative result does not preclude SARS-Cov-2 infection and should not be used as the sole basis for treatment or other patient management decisions. A negative result may occur with  improper specimen collection/handling, submission of specimen other than nasopharyngeal swab, presence of viral mutation(s) within the areas targeted by this assay, and inadequate  number of viral copies (<131 copies/mL). A negative result must be combined with clinical observations, patient history, and epidemiological information. The expected result is Negative. Fact Sheet for Patients:  PinkCheek.be Fact Sheet for Healthcare Providers:  GravelBags.it This test is not yet ap proved or cleared by the [place] FDA and  has been authorized for detection and/or diagnosis of SARS-CoV-2 by FDA under an Emergency Use Authorization (EUA). This EUA will remain  in effect (meaning this test can be used) for the duration of the COVID-19 declaration under Section 564(b)(1) of the Act, 21 U.S.C. section 360bbb-3(b)(1), unless the authorization is terminated or revoked sooner.    Influenza A by PCR NEGATIVE NEGATIVE Final   Influenza B by PCR NEGATIVE NEGATIVE Final    Comment: (NOTE) The Xpert Xpress SARS-CoV-2/FLU/RSV assay is intended as an aid in  the diagnosis of influenza from Nasopharyngeal swab specimens and  should not be used as a sole basis for treatment. Nasal washings and  aspirates are unacceptable for Xpert Xpress SARS-CoV-2/FLU/RSV  testing. Fact Sheet for Patients: PinkCheek.be Fact Sheet for Healthcare Providers: GravelBags.it This test is not yet approved or cleared by the [place] FDA and  has been authorized for detection and/or diagnosis of SARS-CoV-2 by  FDA under an Emergency Use Authorization (EUA). This EUA will remain  in effect (meaning this test can be used) for the duration of the  Covid-19 declaration under Section 564(b)(1) of the Act, 21  U.S.C. section 360bbb-3(b)(1), unless the authorization is  terminated or revoked. Performed at [facility], [address], [place],  [address]          Radiology Studies: [facility] Chest [place] 1 View  Result Date: 04/18/2019 CLINICAL DATA:  Short of breath.  Diabetes and hypertension. EXAM: PORTABLE CHEST 1 VIEW COMPARISON:  04/12/2019 FINDINGS: Stable cardiac enlargement. Small right pleural effusion. Pulmonary vascular congestion. Right base atelectasis. Unchanged IMPRESSION: Stable exam. Electronically Signed   By: Kerby Moors M.D.   On: 04/18/2019 10:10        Scheduled Meds: . docusate sodium  200 mg Oral BID  . enoxaparin (LOVENOX) injection  40 mg Subcutaneous Q12H  . insulin aspart  0-20 Units Subcutaneous TID WC  . insulin aspart  0-5 Units Subcutaneous QHS  . sodium chloride flush  10 mL  Intravenous Q12H   Continuous Infusions: . sodium chloride Stopped (04/18/19 0643)     LOS: 8 days    Time spent: 30 mins     Wyvonnia Dusky, MD [facility] Pager [phone]  If 7PM-7AM, please contact night-coverage [email or web address] 04/20/2019, 8:08 AM

## 2019-04-21 ENCOUNTER — Telehealth: Payer: Self-pay | Admitting: Family Medicine

## 2019-04-21 DIAGNOSIS — C3490 Malignant neoplasm of unspecified part of unspecified bronchus or lung: Secondary | ICD-10-CM

## 2019-04-21 LAB — CBC
HCT: 43.2 % (ref 39.0–52.0)
Hemoglobin: 12.9 g/dL — ABNORMAL LOW (ref 13.0–17.0)
MCH: 28.2 pg (ref 26.0–34.0)
MCHC: 29.9 g/dL — ABNORMAL LOW (ref 30.0–36.0)
MCV: 94.3 fL (ref 80.0–100.0)
Platelets: 200 10*3/uL (ref 150–400)
RBC: 4.58 MIL/uL (ref 4.22–5.81)
RDW: 15.7 % — ABNORMAL HIGH (ref 11.5–15.5)
WBC: 7 10*3/uL (ref 4.0–10.5)
nRBC: 0 % (ref 0.0–0.2)

## 2019-04-21 LAB — BASIC METABOLIC PANEL
Anion gap: 10 (ref 5–15)
BUN: 28 mg/dL — ABNORMAL HIGH (ref 8–23)
CO2: 37 mmol/L — ABNORMAL HIGH (ref 22–32)
Calcium: 9.2 mg/dL (ref 8.9–10.3)
Chloride: 94 mmol/L — ABNORMAL LOW (ref 98–111)
Creatinine, Ser: 0.67 mg/dL (ref 0.61–1.24)
GFR calc Af Amer: >60 mL/min (ref >60)
GFR calc non Af Amer: >60 mL/min (ref >60)
Glucose, Bld: 183 mg/dL — ABNORMAL HIGH (ref 70–99)
Potassium: 4.2 mmol/L (ref 3.5–5.1)
Sodium: 141 mmol/L (ref 135–145)

## 2019-04-21 LAB — GLUCOSE, CAPILLARY
Glucose-Capillary: 172 mg/dL — ABNORMAL HIGH (ref 70–99)
Glucose-Capillary: 228 mg/dL — ABNORMAL HIGH (ref 70–99)
Glucose-Capillary: 258 mg/dL — ABNORMAL HIGH (ref 70–99)
Glucose-Capillary: 293 mg/dL — ABNORMAL HIGH (ref 70–99)

## 2019-04-21 MED ORDER — FOLIC ACID 1 MG PO TABS
1.0000 mg | ORAL_TABLET | Freq: Every day | ORAL | Status: DC
  Administered 2019-04-21: 1 mg via ORAL
  Administered 2019-04-22: 200 mg via ORAL
  Filled 2019-04-21 (×2): qty 1

## 2019-04-21 MED ORDER — POLYVINYL ALCOHOL 1.4 % OP SOLN
1.0000 [drp] | OPHTHALMIC | Status: DC | PRN
  Filled 2019-04-21: qty 15

## 2019-04-21 MED ORDER — PRAVASTATIN SODIUM 40 MG PO TABS
40.0000 mg | ORAL_TABLET | Freq: Every day | ORAL | Status: DC
  Administered 2019-04-21: 40 mg via ORAL
  Filled 2019-04-21: qty 1

## 2019-04-21 MED ORDER — FUROSEMIDE 20 MG PO TABS
20.0000 mg | ORAL_TABLET | Freq: Every day | ORAL | Status: DC
  Administered 2019-04-21 – 2019-04-22 (×2): 20 mg via ORAL
  Filled 2019-04-21 (×2): qty 1

## 2019-04-21 MED ORDER — METOPROLOL SUCCINATE ER 100 MG PO TB24
100.0000 mg | ORAL_TABLET | Freq: Every day | ORAL | Status: DC
  Administered 2019-04-21 – 2019-04-22 (×2): 100 mg via ORAL
  Filled 2019-04-21 (×2): qty 1

## 2019-04-21 NOTE — Progress Notes (Signed)
OT Cancellation Note  Patient Details Name: JAELEN SOTH MRN: [id number] DOB: 02/18/1946   Cancelled Treatment:    Reason Eval/Treat Not Completed: Patient at procedure or test/ unavailable  Pt working with physical therapy when OT presents to see pt for treatment. Will f/u for OT treatment as time permits.   Gerrianne Scale, [place], OTR/L ascom [phone] 04/21/19, 2:50 PM
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
## 2019-04-21 NOTE — Progress Notes (Signed)
Scott Simpson   DOB:[birth year]   QM#:[id number]    Subjective: Patient resting in the bed no acute distress.  No acute events overnight.  Is alone.  Objective:  Vitals:   04/21/19 1206 04/21/19 1624  BP: 134/80 130/76  Pulse: 75 74  Resp: 18 18  Temp: 98.5 F (36.9 C) 97.7 F (36.5 C)  SpO2: (!) 88% 92%     Intake/Output Summary (Last 24 hours) at 04/21/2019 1918 Last data filed at 04/21/2019 0449 Gross per 24 hour  Intake 50 ml  Output 250 ml  Net -200 ml    Physical Exam  Constitutional: He is oriented to person, place, and time and well-developed, well-nourished, and in no distress.  Morbidly obese.  HENT:  Head: Normocephalic and atraumatic.  Mouth/Throat: Oropharynx is clear and moist. No oropharyngeal exudate.  Eyes: Pupils are equal, round, and reactive to light.  Cardiovascular: Normal rate and regular rhythm.  Pulmonary/Chest: No respiratory distress. He has no wheezes.  Decreased air entry bilaterally at the bases.   Abdominal: Soft. Bowel sounds are normal. He exhibits no distension and no mass. There is no abdominal tenderness. There is no rebound and no guarding.  Musculoskeletal:        General: No tenderness or edema. Normal range of motion.     Cervical back: Normal range of motion and neck supple.  Neurological: He is alert and oriented to person, place, and time.  Skin: Skin is warm.  Psychiatric: Affect normal.  Poor judgment; poor attention.    Labs:  Lab Results  Component Value Date   WBC 7.0 04/21/2019   HGB 12.9 (L) 04/21/2019   HCT 43.2 04/21/2019   MCV 94.3 04/21/2019   PLT 200 04/21/2019   NEUTROABS 8.2 (H) 04/12/2019    Lab Results  Component Value Date   NA 141 04/21/2019   K 4.2 04/21/2019   CL 94 (L) 04/21/2019   CO2 37 (H) 04/21/2019    Studies:  CT HEAD W & WO CONTRAST  Result Date: 04/20/2019 CLINICAL DATA:  Small cell lung cancer.  Restaging. EXAM: CT HEAD WITHOUT AND WITH CONTRAST TECHNIQUE: Contiguous axial images were  obtained from the base of the skull through the vertex without and with intravenous contrast CONTRAST:  53mL OMNIPAQUE IOHEXOL 300 MG/ML  SOLN COMPARISON:  None. FINDINGS: Brain: No evidence of acute infarction, hemorrhage, hydrocephalus, extra-axial collection or mass lesion/mass effect. Generalized volume loss. No contrast-enhancing lesions. There is periventricular hypoattenuation compatible with chronic microvascular disease. Vascular: There is calcific atherosclerosis of the internal carotid arteries at the skull base. Skull: Normal. Negative for fracture or focal lesion. Sinuses/Orbits: No acute finding. Other: None. IMPRESSION: 1. No intracranial metastatic disease. 2. Chronic small vessel disease and generalized volume loss. Electronically Signed   By: Ulyses Jarred M.D.   On: 04/20/2019 20:57    Scott Simpson # 74 year old male patient with multiple medical problems is currently admitted hospital for sepsis/question pneumonia-incidentally noted to have bulky mediastinal adenopathy/right kidney mass  # Bulky mediastinal adenopathy [incidental]-right paratracheal; right Scott Simpson -s/p biopsy positive for small cell cancer. [See discussion below].   #Right kidney mass ~4 cm in size-enhancing; concerning for RCC.  This seems to be unrelated to the positive malignancy noted in the mediastinal lymph nodes.  #Sepsis/pneumonia-broad-spectrum antibiotics.  Improving.  #Morbid obesity/borderline performance status/history of TBI.  #Debility secondary to hospitalization-rehab vs. Home with hospice.   Recommendations/plan:  #Reviewed the patient's recent CT scan of the brain-negative for any brain metastasis.  I would still recommend no further active treatments [chemotherapy or radiation] patient's poor performance status/ TBI etc. I would defer to patient/family [based on circumstances/Covid/family preference] to decide on disposition-rehab with close cardiac follow-up vs. home  with hospice.  Discussed with [employer]. Oncology will follow as needed.     Cammie Sickle, MD 04/21/2019  7:18 PM

## 2019-04-21 NOTE — Progress Notes (Signed)
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  Completed abx course.  Blood cultures NGTD. Sepsis resolved.  Acute hypoxic respiratory failure: likely secondary to multilobar pneumonia +/-diffuse mediastinal lymphadenopathy.  Tested negative for COVID-19 and flu PCR.  Continue on supplemental oxygen & wean as tolerated. Will likely need oxygen at d/c. Currently on 2L [place].    Acute on chronic diastolic CHF:  IV lasix was d/c by palliative care. Echo shows EF of 65-70% with grade 1 diastolic dysfunction and severe LVH.  Mediastinal/hilar lymphadenopathy and right renal mass: Noted this admission.  LDH elevated. CT of the abdomen pelvis again shows exophytic mass over the right upper lobe pole of the kidney.  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 Pulmon, Onco following and recs apprec. Pt's wife & daughter decided to proceed w/ palliative care but pt's family are still deciding between home w/ hospice vs SNF   Right Kidney mass: concerning for RCC. Seems to be unrelated to the small cell carcinoma as per oncology   Hx of traumatic brain injury: does not retain any new memory at all as per pt's daughter  Elevated  troponin: denies chest pain symptoms.  EKG unremarkable.  Likely secondary to demand ischemia   Essential hypertension: on low end of normal. Will continue to monitor.   Morbid obesity: BMI 48.8. Would benefit from weight loss.   AKI: resolved  Acute infectious diarrhea: no further symptoms.  Generalized weakness: OT are recommending home health. PT re-eval recommending SNF   DVT prophylaxis: lovenox  Code Status: full  Family Communication:  Disposition Plan: Still requiring supplemental oxygen & will likely need oxygen to go home with / d/c with. Home health orders & home oxygen orders have already been placed. PT re-eval recommending SNF. Family is still trying to decide between home w/ hospice vs SNF.   Consultants:   Pulmon  Onco  Palliative care    Procedures:    Antimicrobials: completed abx course   Subjective: Pt c/o fatigue  Objective: Vitals:   04/20/19 2000 04/21/19 0443 04/21/19 0751 04/21/19 1206  BP: (!) 144/67 (!) 152/82 126/72 134/80  Pulse: 90 79 82 75  Resp: 20 20 18 18   Temp: 99 F (37.2 C) 98.6 F (37 C) 98.2 F (36.8 C) 98.5 F (36.9 C)  TempSrc: Oral Oral Oral   SpO2: 93% 90% 92% (!) 88%  Weight:  (!) 165.6 kg    Height:        Intake/Output Summary (Last 24 hours) at 04/21/2019 1525 Last data filed at 04/21/2019 0449 Gross per 24 hour  Intake 50 ml  Output 250 ml  Net -200 ml   [employer]  04/19/19 0536 04/20/19 0509 04/21/19 0443  Weight: (!) 163.4 kg (!) 163.8 kg (!) 165.6 kg    Examination:  General exam: Appears calm and comfortable. Morbidly obese  Respiratory system: diminished breath sounds b/l. No rales Cardiovascular system: S1 & S2 +. No , rubs, gallops or clicks. B/l LE edema  Gastrointestinal system: Abdomen is obese, soft and nontender.  Normal bowel sounds heard.  Central nervous system: Alert and oriented. Moves all 4 extremities  Psychiatry: Judgement and insight appear abnormal. Flat mood and affect       Data Reviewed: I have personally reviewed following labs and imaging studies  CBC: Recent Labs  Lab 04/17/19 0524 04/18/19 0540 04/19/19 0335 04/20/19 0523 04/21/19 0532  WBC 5.9 7.9 8.4 7.4 7.0  HGB 12.7* 13.3 13.2 13.4 12.9*  HCT 41.6 44.5 43.5 44.6 43.2  MCV 94.3 94.5 94.2 94.1 94.3  PLT 172 172 185 192 956   Basic Metabolic Panel: Recent Labs  Lab 04/17/19 0524 04/18/19 0540 04/19/19 0335 04/20/19 0523 04/21/19 0532  NA 142 142 141 140 141  K 3.7 3.6 3.9 4.0 4.2  CL 98 96* 95* 94* 94*  CO2 35* 38* 37* 37* 37*  GLUCOSE 148* 161* 168* 167* 183*  BUN 27* 27* 31* 28* 28*  CREATININE 0.72 0.77 0.74 0.60* 0.67  CALCIUM 8.1* 8.6* 8.8* 9.1 9.2   GFR: Estimated Creatinine Clearance: 138.5 mL/min (by C-G formula based on SCr of 0.67 mg/dL). Liver Function Tests: Recent Labs  Lab 04/16/19 0324 04/17/19 0524 04/18/19 0540 04/19/19 0335 04/20/19 0523  AST 42* 41 33 27 24  ALT 35 38 38 36 35  ALKPHOS 44 48 49 53 54  BILITOT 0.6 0.6 0.6 0.7 0.5  PROT 6.4* 6.2* 6.2* 7.0 6.8  ALBUMIN 3.0* 2.9* 2.9* 3.2* 3.1*   No results for input(s): LIPASE, AMYLASE in the last 168 hours. No results for input(s): AMMONIA in the last 168 hours. Coagulation Profile: No results for input(s): INR, PROTIME in the last 168 hours. Cardiac Enzymes: No results for input(s): CKTOTAL, CKMB, CKMBINDEX, TROPONINI in the last 168 hours. BNP (last 3 results) No results for input(s): PROBNP in the last 8760 hours. HbA1C: No results for input(s): HGBA1C in the last 72 hours. CBG: Recent Labs  Lab 04/20/19 1616 04/20/19 2125 04/20/19 2131 04/21/19 0746 04/21/19 1205  GLUCAP 224* 239* 231* 172* 228*   Lipid Profile: No results for input(s): CHOL, HDL, LDLCALC, TRIG, CHOLHDL, LDLDIRECT in the last 72 hours. Thyroid Function Tests: No results for input(s): TSH, T4TOTAL, FREET4, T3FREE, THYROIDAB in the last 72 hours. Anemia Panel: No results for input(s): VITAMINB12, FOLATE,  FERRITIN, TIBC, IRON, RETICCTPCT in the last 72 hours. Sepsis Labs: No results for input(s): PROCALCITON, LATICACIDVEN in the last 168 hours.  Recent Results (from the past 240 hour(s))  Culture, blood (routine x 2)     Status: None   Collection Time: 04/12/19  2:25 PM   Specimen: BLOOD  Result Value Ref Range Status   Specimen Description BLOOD BLOOD LEFT HAND  Final   Special Requests   Final    BOTTLES DRAWN AEROBIC AND ANAEROBIC Blood Culture adequate volume   Culture   Final    NO GROWTH 5 DAYS Performed at [facility], [address], [place], [place] [address]    Report Status 04/17/2019 FINAL  Final  Culture, blood (routine x 2)     Status: None   Collection Time: 04/12/19  2:30 PM   Specimen: BLOOD  Result Value Ref Range Status   Specimen Description BLOOD BLOOD RIGHT FOREARM  Final   Special Requests   Final    BOTTLES DRAWN AEROBIC AND ANAEROBIC Blood Culture adequate volume   Culture   Final    NO GROWTH 5 DAYS Performed at [facility], [address] [place], [place] [address]    Report Status 04/17/2019 FINAL  Final  Respiratory Panel by RT PCR (Flu A&B, Covid) - Nasopharyngeal Swab     Status: None   Collection Time: 04/12/19  3:46 PM   Specimen: Nasopharyngeal Swab  Result Value Ref Range Status   SARS Coronavirus 2 by RT PCR NEGATIVE NEGATIVE Final    Comment: (NOTE) SARS-CoV-2 target nucleic acids are NOT DETECTED. The SARS-CoV-2 RNA is generally detectable in upper respiratoy specimens during the acute phase of infection. The lowest concentration of SARS-CoV-2 viral copies this assay can detect is 131 copies/mL. A negative result does not preclude SARS-Cov-2 infection and should not be used as the sole basis for treatment or other patient management decisions. A negative result may occur with  improper specimen collection/handling, submission of specimen other than nasopharyngeal swab, presence of viral mutation(s) within  the areas targeted by this assay, and inadequate number of viral copies (<131 copies/mL). A negative result must be combined with clinical observations, patient history, and epidemiological information. The expected result is Negative. Fact Sheet for Patients:  PinkCheek.be Fact Sheet for Healthcare Providers:  GravelBags.it This test is not yet ap proved or cleared by the [place] FDA and  has been authorized for detection and/or diagnosis of SARS-CoV-2 by FDA under an Emergency Use Authorization (EUA). This EUA will remain  in effect (meaning this test can be used) for the duration of the COVID-19 declaration under Section 564(b)(1) of the Act, 21 U.S.C. section 360bbb-3(b)(1), unless the authorization is terminated or revoked sooner.    Influenza A by PCR NEGATIVE NEGATIVE Final   Influenza B by PCR NEGATIVE NEGATIVE Final    Comment: (NOTE) The Xpert Xpress SARS-CoV-2/FLU/RSV assay is intended as an aid in  the diagnosis of influenza from Nasopharyngeal swab specimens and  should not be used as a sole basis for treatment. Nasal washings and  aspirates are unacceptable for Xpert Xpress SARS-CoV-2/FLU/RSV  testing. Fact Sheet for Patients: PinkCheek.be Fact Sheet for Healthcare Providers: GravelBags.it This test is not yet approved or cleared by the [place] FDA and  has been authorized for detection and/or diagnosis of SARS-CoV-2 by  FDA under an Emergency Use Authorization (EUA). This EUA will remain  in effect (meaning this test can be used) for the duration of the  Covid-19 declaration under Section 564(b)(1) of the Act, 21  U.S.C. section 360bbb-3(b)(1), unless the authorization is  terminated or revoked. Performed at [facility], [address] [place], [place] [address]          Radiology Studies: CT HEAD W & WO  CONTRAST  Result Date: 04/20/2019 CLINICAL DATA:  Small cell lung cancer.  Restaging. EXAM: CT HEAD WITHOUT AND WITH CONTRAST TECHNIQUE: Contiguous axial images were obtained from the base of the skull through the vertex without and with intravenous contrast CONTRAST:  80mL OMNIPAQUE IOHEXOL 300 MG/ML  SOLN COMPARISON:  None. FINDINGS: Brain: No evidence of acute infarction, hemorrhage, hydrocephalus, extra-axial collection or mass lesion/mass effect. Generalized volume loss. No contrast-enhancing lesions. There is periventricular hypoattenuation compatible with chronic microvascular disease. Vascular: There is calcific atherosclerosis of the internal carotid arteries at the skull  base. Skull: Normal. Negative for fracture or focal lesion. Sinuses/Orbits: No acute finding. Other: None. IMPRESSION: 1. No intracranial metastatic disease. 2. Chronic small vessel disease and generalized volume loss. Electronically Signed   By: Ulyses Jarred M.D.   On: 04/20/2019 20:57        Scheduled Meds: . docusate sodium  200 mg Oral BID  . enoxaparin (LOVENOX) injection  40 mg Subcutaneous Q12H  . insulin aspart  0-20 Units Subcutaneous TID WC  . insulin aspart  0-5 Units Subcutaneous QHS  . sodium chloride flush  10 mL Intravenous Q12H   Continuous Infusions: . sodium chloride Stopped (04/18/19 0643)     LOS: 9 days    Time spent: 30 mins     Wyvonnia Dusky, MD [facility] Pager [phone]  If 7PM-7AM, please contact night-coverage [email or web address] 04/21/2019, 3:25 PM

## 2019-04-21 NOTE — Telephone Encounter (Signed)
Copied from [place] [phone]. Topic: General - Other >> Apr 21, 2019  3:15 PM Leward Quan A wrote: Reason for CRM: Colletta Maryland with [facility] called to inquire if Dr Caryn Section will sign Home care orders for patient even though he have not been seen in a few months Please call Colletta Maryland at Ph# [phone]
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
## 2019-04-22 ENCOUNTER — Encounter: Payer: Self-pay | Admitting: Internal Medicine

## 2019-04-22 DIAGNOSIS — R778 Other specified abnormalities of plasma proteins: Secondary | ICD-10-CM | POA: Diagnosis not present

## 2019-04-22 DIAGNOSIS — K5909 Other constipation: Secondary | ICD-10-CM | POA: Diagnosis not present

## 2019-04-22 DIAGNOSIS — R590 Localized enlarged lymph nodes: Secondary | ICD-10-CM | POA: Diagnosis not present

## 2019-04-22 DIAGNOSIS — A419 Sepsis, unspecified organism: Secondary | ICD-10-CM | POA: Diagnosis not present

## 2019-04-22 DIAGNOSIS — I5032 Chronic diastolic (congestive) heart failure: Secondary | ICD-10-CM | POA: Diagnosis not present

## 2019-04-22 DIAGNOSIS — J9601 Acute respiratory failure with hypoxia: Secondary | ICD-10-CM | POA: Diagnosis not present

## 2019-04-22 DIAGNOSIS — Z794 Long term (current) use of insulin: Secondary | ICD-10-CM

## 2019-04-22 DIAGNOSIS — R498 Other voice and resonance disorders: Secondary | ICD-10-CM | POA: Diagnosis not present

## 2019-04-22 DIAGNOSIS — M6281 Muscle weakness (generalized): Secondary | ICD-10-CM | POA: Diagnosis not present

## 2019-04-22 DIAGNOSIS — E118 Type 2 diabetes mellitus with unspecified complications: Secondary | ICD-10-CM | POA: Diagnosis not present

## 2019-04-22 DIAGNOSIS — E785 Hyperlipidemia, unspecified: Secondary | ICD-10-CM | POA: Diagnosis not present

## 2019-04-22 DIAGNOSIS — E569 Vitamin deficiency, unspecified: Secondary | ICD-10-CM | POA: Diagnosis not present

## 2019-04-22 DIAGNOSIS — I10 Essential (primary) hypertension: Secondary | ICD-10-CM | POA: Diagnosis not present

## 2019-04-22 DIAGNOSIS — Z87820 Personal history of traumatic brain injury: Secondary | ICD-10-CM | POA: Diagnosis not present

## 2019-04-22 DIAGNOSIS — Z7401 Bed confinement status: Secondary | ICD-10-CM | POA: Diagnosis not present

## 2019-04-22 DIAGNOSIS — N2889 Other specified disorders of kidney and ureter: Secondary | ICD-10-CM | POA: Diagnosis not present

## 2019-04-22 DIAGNOSIS — J189 Pneumonia, unspecified organism: Secondary | ICD-10-CM

## 2019-04-22 DIAGNOSIS — I959 Hypotension, unspecified: Secondary | ICD-10-CM | POA: Diagnosis not present

## 2019-04-22 DIAGNOSIS — R2681 Unsteadiness on feet: Secondary | ICD-10-CM | POA: Diagnosis not present

## 2019-04-22 DIAGNOSIS — E1165 Type 2 diabetes mellitus with hyperglycemia: Secondary | ICD-10-CM | POA: Diagnosis not present

## 2019-04-22 DIAGNOSIS — H04123 Dry eye syndrome of bilateral lacrimal glands: Secondary | ICD-10-CM | POA: Diagnosis not present

## 2019-04-22 DIAGNOSIS — M2550 Pain in unspecified joint: Secondary | ICD-10-CM | POA: Diagnosis not present

## 2019-04-22 DIAGNOSIS — R6520 Severe sepsis without septic shock: Secondary | ICD-10-CM | POA: Diagnosis not present

## 2019-04-22 DIAGNOSIS — H9190 Unspecified hearing loss, unspecified ear: Secondary | ICD-10-CM | POA: Diagnosis not present

## 2019-04-22 DIAGNOSIS — R488 Other symbolic dysfunctions: Secondary | ICD-10-CM | POA: Diagnosis not present

## 2019-04-22 DIAGNOSIS — C3490 Malignant neoplasm of unspecified part of unspecified bronchus or lung: Secondary | ICD-10-CM | POA: Diagnosis not present

## 2019-04-22 LAB — CBC
HCT: 43.6 % (ref 39.0–52.0)
Hemoglobin: 13 g/dL (ref 13.0–17.0)
MCH: 28 pg (ref 26.0–34.0)
MCHC: 29.8 g/dL — ABNORMAL LOW (ref 30.0–36.0)
MCV: 94 fL (ref 80.0–100.0)
Platelets: 209 10*3/uL (ref 150–400)
RBC: 4.64 MIL/uL (ref 4.22–5.81)
RDW: 15.7 % — ABNORMAL HIGH (ref 11.5–15.5)
WBC: 7.6 10*3/uL (ref 4.0–10.5)
nRBC: 0 % (ref 0.0–0.2)

## 2019-04-22 LAB — BASIC METABOLIC PANEL
Anion gap: 9 (ref 5–15)
BUN: 27 mg/dL — ABNORMAL HIGH (ref 8–23)
CO2: 36 mmol/L — ABNORMAL HIGH (ref 22–32)
Calcium: 9.4 mg/dL (ref 8.9–10.3)
Chloride: 95 mmol/L — ABNORMAL LOW (ref 98–111)
Creatinine, Ser: 0.63 mg/dL (ref 0.61–1.24)
GFR calc Af Amer: >60 mL/min (ref >60)
GFR calc non Af Amer: >60 mL/min (ref >60)
Glucose, Bld: 168 mg/dL — ABNORMAL HIGH (ref 70–99)
Potassium: 4.4 mmol/L (ref 3.5–5.1)
Sodium: 140 mmol/L (ref 135–145)

## 2019-04-22 LAB — GLUCOSE, CAPILLARY
Glucose-Capillary: 179 mg/dL — ABNORMAL HIGH (ref 70–99)
Glucose-Capillary: 234 mg/dL — ABNORMAL HIGH (ref 70–99)

## 2019-04-22 LAB — RESPIRATORY PANEL BY RT PCR (FLU A&B, COVID)
Influenza A by PCR: NEGATIVE
Influenza B by PCR: NEGATIVE
SARS Coronavirus 2 by RT PCR: NEGATIVE

## 2019-04-22 MED ORDER — POLYVINYL ALCOHOL 1.4 % OP SOLN: 1.0000 [drp] | OPHTHALMIC | 0 refills | Status: AC | PRN

## 2019-04-22 MED ORDER — BISACODYL 5 MG PO TBEC: 10.0000 mg | DELAYED_RELEASE_TABLET | Freq: Every day | ORAL | 0 refills | Status: AC | PRN

## 2019-04-22 MED ORDER — INSULIN ASPART 100 UNIT/ML ~~LOC~~ SOLN: 0.0000 [IU] | Freq: Three times a day (TID) | SUBCUTANEOUS | 11 refills | Status: AC

## 2019-04-22 MED ORDER — INSULIN ASPART 100 UNIT/ML ~~LOC~~ SOLN: 0.0000 [IU] | Freq: Every day | SUBCUTANEOUS | 11 refills | Status: AC

## 2019-04-22 MED ORDER — FUROSEMIDE 20 MG PO TABS: 20.0000 mg | ORAL_TABLET | Freq: Every day | ORAL | Status: AC

## 2019-04-22 MED ORDER — DOCUSATE SODIUM 100 MG PO CAPS: 200.0000 mg | ORAL_CAPSULE | Freq: Two times a day (BID) | ORAL | 0 refills | Status: AC

## 2019-04-22 NOTE — NC FL2 (Signed)
[place] LEVEL OF CARE SCREENING TOOL     IDENTIFICATION  Patient Name: Scott Simpson Birthdate: Jul 09, 1945 Sex: male Admission Date (Current Location): 04/12/2019  [place] and [place] Number:  Engineering geologist and Address:  [facility], [address], [place], [place] [address]      Provider Number: [id number]  Attending Physician Name and Address:  Nolberto Hanlon, MD  Relative Name and Phone Number:  Scott Simpson  [phone]    Current Level of Care: Hospital Recommended Level of Care: [place] Prior Approval Number:    Date Approved/Denied:   PASRR Number: [id number]  Discharge Plan: SNF    Current Diagnoses: Patient Active Problem List   Diagnosis Date Noted  . Palliative care encounter   . Small cell carcinoma of lung ([place])   . Mediastinal lymphadenopathy   . History of traumatic brain injury 04/12/2019  . 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 Acute respiratory failure with hypoxia ([place]) 04/12/2019  . Hilar lymphadenopathy 04/12/2019  . AKI (acute kidney injury) ([place]) 04/12/2019  . Sepsis ([place]) 04/12/2019  . Acute infectious diarrhea 04/12/2019  . Bilateral pneumonia 04/12/2019  . Elevated troponin 04/12/2019  . Impaired mobility 11/21/2015  . Abnormal ECG 11/15/2014  . Adenoma of rectum 11/15/2014  . Diabetes mellitus with complication, with long-term current use of insulin ([place]) 11/15/2014  . Chronic diastolic CHF (congestive heart failure) ([place]) 11/15/2014  . Edema 11/15/2014  . ED (erectile dysfunction) of organic origin 11/15/2014  . H/O head injury 11/15/2014  . Hearing loss 11/15/2014  . HLD (hyperlipidemia) 11/15/2014  . Hypertension 11/15/2014  . Kidney stone 11/15/2014  . Morbid obesity ([place]) 11/15/2014  . Seborrhea 11/15/2014  . Spinal stenosis of lumbar region 11/15/2014  . Fatty infiltration of liver 07/11/2010  . History of colon polyps 04/01/2002     Orientation RESPIRATION BLADDER Height & Weight     Self, Time, Situation, Place  O2 Incontinent Weight: (!) 165.6 kg Height:  6\' 2"  (188 cm)  BEHAVIORAL SYMPTOMS/MOOD NEUROLOGICAL BOWEL NUTRITION STATUS      Continent Diet  AMBULATORY STATUS COMMUNICATION OF NEEDS Skin   Independent Verbally Normal                       Personal Care Assistance Level of Assistance  Bathing, Feeding, Dressing Bathing Assistance: Limited assistance Feeding assistance: Independent Dressing Assistance: Limited assistance     Functional Limitations Info  Sight Sight Info: Adequate        SPECIAL CARE FACTORS FREQUENCY  PT (By licensed PT), OT (By licensed OT)     PT Frequency: 5x a week OT Frequency: 5x a week            Contractures Contractures Info: Not present    Additional Factors Info  Insulin Sliding Scale       Insulin Sliding Scale Info: see med list       Current Medications (04/22/2019):  This is the current hospital active medication list Current Facility-Administered Medications  Medication Dose Route Frequency Provider Last Rate Last Admin  . 0.9 %  sodium chloride infusion   Intravenous PRN [name], MD   Stopped at 04/18/19 [phone]  . bisacodyl (DULCOLAX) EC tablet 10 mg  10 mg Oral Daily PRN Wyvonnia Dusky, MD      . docusate sodium (COLACE) capsule 200 mg  200 mg Oral BID Wyvonnia Dusky, MD   200 mg  at 04/22/19 1011  . enoxaparin (LOVENOX) injection 40 mg  40 mg Subcutaneous Q12H Ottie Glazier, MD   40 mg at 04/22/19 1013  . folic acid (FOLVITE) tablet 1 mg  1 mg Oral Daily Eppie Gibson M, MD   200 mg at 04/22/19 1011  . furosemide (LASIX) tablet 20 mg  20 mg Oral Daily Wyvonnia Dusky, MD   20 mg at 04/22/19 1014  . hydrALAZINE (APRESOLINE) tablet 50 mg  50 mg Oral Q6H PRN Wyvonnia Dusky, MD   50 mg at 04/20/19 1633  . insulin aspart (novoLOG) injection 0-20 Units  0-20 Units Subcutaneous TID WC [name], MD   4  Units at 04/22/19 1010  . insulin aspart (novoLOG) injection 0-5 Units  0-5 Units Subcutaneous QHS [name], MD   3 Units at 04/21/19 2232  . metoprolol succinate (TOPROL-XL) 24 hr tablet 100 mg  100 mg Oral Daily Wyvonnia Dusky, MD   100 mg at 04/22/19 1014  . ondansetron (ZOFRAN) tablet 4 mg  4 mg Oral Q6H PRN [name], MD       Or  . ondansetron [facility]) injection 4 mg  4 mg Intravenous Q6H PRN [name], MD      . polyvinyl alcohol (LIQUIFILM TEARS) 1.4 % ophthalmic solution 1 drop  1 drop Both Eyes PRN Wyvonnia Dusky, MD      . pravastatin (PRAVACHOL) tablet 40 mg  40 mg Oral q1800 Wyvonnia Dusky, MD   40 mg at 04/21/19 1733  . sodium chloride flush (NS) 0.9 % injection 10 mL  10 mL Intravenous Q12H [name], MD   10 mL at 04/22/19 1014     Discharge Medications: Please see discharge summary for a list of discharge medications.  Relevant Imaging Results:  Relevant Lab Results:   Additional Information    Victorino Dike, RN
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
## 2019-04-22 NOTE — Discharge Summary (Signed)
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 94.5 94.2 94.1 94.3 94.0  PLT 172 185 192 200 209   Cardiac Enzymes: No results for input(s): CKTOTAL, CKMB, CKMBINDEX, TROPONINI in the last 168 hours. BNP: Invalid input(s): POCBNP CBG: Recent Labs  Lab 04/21/19 1205 04/21/19 1623 04/21/19 2142 04/22/19 0807 04/22/19 1203  GLUCAP 228* 258* 293* 179* 234*   D-Dimer No results for input(s): DDIMER in the last 72 hours. Hgb A1c No results for input(s): HGBA1C in the last 72 hours. Lipid Profile No results for input(s): CHOL, HDL, LDLCALC, TRIG, CHOLHDL, LDLDIRECT in the last 72 hours. Thyroid function studies No results for input(s): TSH, T4TOTAL, T3FREE, THYROIDAB in the last 72 hours.  Invalid input(s): FREET3 Anemia work up No results for input(s): VITAMINB12, FOLATE, FERRITIN, TIBC, IRON, RETICCTPCT in the last  72 hours. Urinalysis    Component Value Date/Time   COLORURINE YELLOW (A) 04/12/2019 1425   APPEARANCEUR HAZY (A) 04/12/2019 1425   LABSPEC >1.046 (H) 04/12/2019 1425   PHURINE 5.0 04/12/2019 1425   GLUCOSEU NEGATIVE 04/12/2019 1425   HGBUR SMALL (A) 04/12/2019 1425   BILIRUBINUR NEGATIVE 04/12/2019 1425   KETONESUR NEGATIVE 04/12/2019 1425   PROTEINUR 100 (A) 04/12/2019 1425   NITRITE NEGATIVE 04/12/2019 1425   LEUKOCYTESUR NEGATIVE 04/12/2019 1425   Sepsis Labs Invalid input(s): PROCALCITONIN,  WBC,  LACTICIDVEN Microbiology Recent Results (from the past 240 hour(s))  Culture, blood (routine x 2)     Status: None   Collection Time: 04/12/19  2:25 PM   Specimen: BLOOD  Result Value Ref Range Status   Specimen Description BLOOD BLOOD LEFT HAND  Final   Special Requests   Final    BOTTLES DRAWN AEROBIC AND ANAEROBIC Blood Culture adequate volume   Culture   Final    NO GROWTH 5 DAYS Performed at [facility], [address] [place], [place] [address]    Report Status 04/17/2019 FINAL  Final  Culture, blood (routine x 2)     Status: None   Collection Time: 04/12/19  2:30 PM   Specimen: BLOOD  Result Value Ref Range Status   Specimen Description BLOOD BLOOD RIGHT FOREARM  Final   Special Requests   Final    BOTTLES DRAWN AEROBIC AND ANAEROBIC Blood Culture adequate volume   Culture   Final    NO GROWTH 5 DAYS Performed at [facility], [place], [place]  [address]    Report Status 04/17/2019 FINAL  Final  Respiratory Panel by RT PCR (Flu A&B, Covid) - Nasopharyngeal Swab     Status: None   Collection Time: 04/12/19  3:46 PM   Specimen: Nasopharyngeal Swab  Result Value Ref Range Status   SARS Coronavirus 2 by RT PCR NEGATIVE NEGATIVE Final    Comment: (NOTE) SARS-CoV-2 target nucleic acids are NOT DETECTED. The SARS-CoV-2 RNA is generally detectable in upper respiratoy specimens during the acute phase of infection. The  lowest concentration of SARS-CoV-2 viral copies this assay can detect is 131 copies/mL. A negative result does not preclude SARS-Cov-2 infection and should not be used as the sole basis for treatment or other patient management decisions. A negative result may occur with  improper specimen collection/handling, submission of specimen other than nasopharyngeal swab, presence of viral mutation(s) within the areas targeted by this assay, and inadequate number of viral copies (<131 copies/mL). A negative result must be combined with clinical observations, patient history, and epidemiological information. The expected result is Negative. Fact Sheet for Patients:  PinkCheek.be Fact Sheet for Healthcare Providers:  GravelBags.it This test is not yet ap proved or cleared by the [place] FDA and  has been authorized for detection and/or diagnosis of SARS-CoV-2 by FDA under an Emergency Use Authorization (EUA). This EUA will remain  in effect (meaning this test can be used) for the duration of the COVID-19 declaration under Section 564(b)(1) of the Act, 21 U.S.C. section 360bbb-3(b)(1), unless the authorization is terminated or revoked sooner.    Influenza A by PCR NEGATIVE NEGATIVE Final   Influenza B by PCR NEGATIVE NEGATIVE Final    Comment: (NOTE) The Xpert Xpress SARS-CoV-2/FLU/RSV assay is intended as an aid in  the diagnosis of influenza from Nasopharyngeal swab specimens and  should not be used as a sole basis for treatment. Nasal washings and  aspirates are unacceptable for Xpert Xpress SARS-CoV-2/FLU/RSV  testing. Fact Sheet for Patients: PinkCheek.be Fact Sheet for Healthcare Providers: GravelBags.it This test is not yet approved or cleared by the [place] FDA and  has been authorized for detection and/or diagnosis of SARS-CoV-2 by  FDA under an Emergency  Use Authorization (EUA). This EUA will remain  in effect (meaning this test can be used) for the duration of the  Covid-19 declaration under Section 564(b)(1) of the Act, 21  U.S.C. section 360bbb-3(b)(1), unless the authorization is  terminated or revoked. Performed at [facility], [address] [place], [place] [address]    Severe sepsis with septic shock: secondary to multilobar pneumonia.Completed abx course. Blood cultures NGTD.Sepsis resolved.  Acute hypoxic respiratory failure: likely secondary to multilobar pneumonia+/-diffuse mediastinal lymphadenopathy. Tested negative for COVID-19 and flu PCR. Continue on supplemental oxygen & wean as tolerated.Will likely need oxygen at d/c. Currently on 2L [place].    Acute on chronic diastolic CHF:  IV lasix was d/c . Currently euvolemic. Echo shows EF of 65-70% with grade 1 diastolic dysfunction and severe LVH.  Mediastinal/hilarlymphadenopathy and right renal mass: Noted this admission. LDH elevated. CT of the abdomen pelvis again shows exophytic mass over the right upper lobe pole of the kidney.  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  Can f/u with oncology as outpt. Due to pt's performance status no further recommendation for active treatment such as chemotherapy or radiation. Recommend using an incentive spirometer  Right Kidney mass: concerning for RCC. 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 EKG unremarkable. Likely secondary to demand ischemia   Essential hypertension: on low end of normal. Will continue to monitor.   Morbid obesity: BMI 48.8. Would benefit from weight loss.   AKI: resolved  Acute infectious diarrhea: no further symptoms.  Generalized weakness: OT/PT at SNF   Time coordinating discharge: Over 30  minutes  SIGNED:   Nolberto Hanlon, MD  [facility] 04/22/2019, 2:14 PM Pager   If 7PM-7AM, please contact night-coverage [email or web address] Password [email or web address]

## 2019-04-23 ENCOUNTER — Other Ambulatory Visit: Payer: Medicare Other

## 2019-04-23 NOTE — Progress Notes (Signed)
Tumor Board Documentation  OSWELL SAY was presented by Dr Rogue Bussing at our Tumor Board on 04/23/2019, which included representatives from medical oncology, radiation oncology, navigation, pathology, radiology, surgical, internal medicine, genetics, pulmonology, palliative care, research.  Syncere currently presents as a new patient, for [place], for new positive pathology with history of the following treatments: active survellience, surgical intervention(s).  Additionally, we reviewed previous medical and familial history, history of present illness, and recent lab results along with all available histopathologic and imaging studies. The tumor board considered available treatment options and made the following recommendations:   Best supportive care  The following procedures/referrals were also placed: No orders of the defined types were placed in this encounter.   Clinical Trial Status: not discussed   Staging used: Pathologic Stage  AJCC Staging: T: x N: 2 M: 0 Group: Small Cell Cancer of Unknown Primary   National site-specific guidelines   were discussed with respect to the case.  Tumor board is a meeting of clinicians from various specialty areas who evaluate and discuss patients for whom a multidisciplinary approach is being considered. Final determinations in the plan of care are those of the provider(s). The responsibility for follow up of recommendations given during tumor board is that of the provider.   Today's extended care, comprehensive team conference, Davante was not present for the discussion and was not examined.   Multidisciplinary Tumor Board is a multidisciplinary case peer review process.  Decisions discussed in the Multidisciplinary Tumor Board reflect the opinions of the specialists present at the conference without having examined the patient.  Ultimately, treatment and diagnostic decisions rest with the primary provider(s) and the patient.

## 2019-04-24 ENCOUNTER — Telehealth: Payer: Self-pay | Admitting: Hospice and Palliative Medicine

## 2019-04-24 NOTE — Telephone Encounter (Signed)
That's fine

## 2019-04-24 NOTE — Telephone Encounter (Signed)
I spoke with patient's wife by phone.  She said the patient is doing better at rehab and the family are anxious to get him home.  She wants to arrange hospice services at home and asked for me to help coordinate.  After help with completing FMLA papers and would like a prescription for a lift chair.    Discussed with Hearther, RN. Family did drop off FMLA papers earlier today and we will work on completing these. I did call in a verbal order for hospice to [facility].

## 2019-04-24 NOTE — Telephone Encounter (Signed)
I spoke with nurse case manager and gave Verbal orders below. I was then advised that [facility] did not admit patient because patient did not come home from the skilled nursing facility as planned.

## 2019-04-27 ENCOUNTER — Encounter: Payer: Self-pay | Admitting: *Deleted

## 2019-04-27 DIAGNOSIS — Z87891 Personal history of nicotine dependence: Secondary | ICD-10-CM | POA: Diagnosis not present

## 2019-04-27 DIAGNOSIS — J189 Pneumonia, unspecified organism: Secondary | ICD-10-CM | POA: Diagnosis not present

## 2019-04-27 DIAGNOSIS — E785 Hyperlipidemia, unspecified: Secondary | ICD-10-CM | POA: Diagnosis not present

## 2019-04-27 DIAGNOSIS — J9621 Acute and chronic respiratory failure with hypoxia: Secondary | ICD-10-CM | POA: Diagnosis not present

## 2019-04-27 DIAGNOSIS — N2889 Other specified disorders of kidney and ureter: Secondary | ICD-10-CM | POA: Diagnosis not present

## 2019-04-27 DIAGNOSIS — E278 Other specified disorders of adrenal gland: Secondary | ICD-10-CM | POA: Diagnosis not present

## 2019-04-27 DIAGNOSIS — Z87820 Personal history of traumatic brain injury: Secondary | ICD-10-CM | POA: Diagnosis not present

## 2019-04-27 DIAGNOSIS — E119 Type 2 diabetes mellitus without complications: Secondary | ICD-10-CM | POA: Diagnosis not present

## 2019-04-27 DIAGNOSIS — I110 Hypertensive heart disease with heart failure: Secondary | ICD-10-CM | POA: Diagnosis not present

## 2019-04-27 DIAGNOSIS — C801 Malignant (primary) neoplasm, unspecified: Secondary | ICD-10-CM | POA: Diagnosis not present

## 2019-04-27 DIAGNOSIS — Z794 Long term (current) use of insulin: Secondary | ICD-10-CM | POA: Diagnosis not present

## 2019-04-27 DIAGNOSIS — I5030 Unspecified diastolic (congestive) heart failure: Secondary | ICD-10-CM | POA: Diagnosis not present

## 2019-04-27 DIAGNOSIS — C7989 Secondary malignant neoplasm of other specified sites: Secondary | ICD-10-CM | POA: Diagnosis not present

## 2019-04-27 DIAGNOSIS — M4800 Spinal stenosis, site unspecified: Secondary | ICD-10-CM | POA: Diagnosis not present

## 2019-04-27 DIAGNOSIS — J449 Chronic obstructive pulmonary disease, unspecified: Secondary | ICD-10-CM | POA: Diagnosis not present

## 2019-04-27 DIAGNOSIS — Z9981 Dependence on supplemental oxygen: Secondary | ICD-10-CM | POA: Diagnosis not present

## 2019-04-27 DIAGNOSIS — E669 Obesity, unspecified: Secondary | ICD-10-CM | POA: Diagnosis not present

## 2019-04-28 ENCOUNTER — Telehealth: Payer: Self-pay | Admitting: *Deleted

## 2019-04-28 DIAGNOSIS — E278 Other specified disorders of adrenal gland: Secondary | ICD-10-CM | POA: Diagnosis not present

## 2019-04-28 DIAGNOSIS — C7989 Secondary malignant neoplasm of other specified sites: Secondary | ICD-10-CM | POA: Diagnosis not present

## 2019-04-28 DIAGNOSIS — J189 Pneumonia, unspecified organism: Secondary | ICD-10-CM | POA: Diagnosis not present

## 2019-04-28 DIAGNOSIS — J9621 Acute and chronic respiratory failure with hypoxia: Secondary | ICD-10-CM | POA: Diagnosis not present

## 2019-04-28 DIAGNOSIS — C801 Malignant (primary) neoplasm, unspecified: Secondary | ICD-10-CM | POA: Diagnosis not present

## 2019-04-28 DIAGNOSIS — N2889 Other specified disorders of kidney and ureter: Secondary | ICD-10-CM | POA: Diagnosis not present

## 2019-04-28 NOTE — Telephone Encounter (Signed)
Faxed Wife's fmla papers to patient's wife's workplace- at [employer] [phone]).  Contacted daughter- She will update her mother on the fmla papers.

## 2019-04-29 DIAGNOSIS — N2889 Other specified disorders of kidney and ureter: Secondary | ICD-10-CM | POA: Diagnosis not present

## 2019-04-29 DIAGNOSIS — C801 Malignant (primary) neoplasm, unspecified: Secondary | ICD-10-CM | POA: Diagnosis not present

## 2019-04-29 DIAGNOSIS — J9621 Acute and chronic respiratory failure with hypoxia: Secondary | ICD-10-CM | POA: Diagnosis not present

## 2019-04-29 DIAGNOSIS — E278 Other specified disorders of adrenal gland: Secondary | ICD-10-CM | POA: Diagnosis not present

## 2019-04-29 DIAGNOSIS — C7989 Secondary malignant neoplasm of other specified sites: Secondary | ICD-10-CM | POA: Diagnosis not present

## 2019-04-29 DIAGNOSIS — J189 Pneumonia, unspecified organism: Secondary | ICD-10-CM | POA: Diagnosis not present

## 2019-04-30 DIAGNOSIS — C801 Malignant (primary) neoplasm, unspecified: Secondary | ICD-10-CM | POA: Diagnosis not present

## 2019-04-30 DIAGNOSIS — E278 Other specified disorders of adrenal gland: Secondary | ICD-10-CM | POA: Diagnosis not present

## 2019-04-30 DIAGNOSIS — C7989 Secondary malignant neoplasm of other specified sites: Secondary | ICD-10-CM | POA: Diagnosis not present

## 2019-04-30 DIAGNOSIS — N2889 Other specified disorders of kidney and ureter: Secondary | ICD-10-CM | POA: Diagnosis not present

## 2019-04-30 DIAGNOSIS — J9621 Acute and chronic respiratory failure with hypoxia: Secondary | ICD-10-CM | POA: Diagnosis not present

## 2019-04-30 DIAGNOSIS — J189 Pneumonia, unspecified organism: Secondary | ICD-10-CM | POA: Diagnosis not present

## 2019-05-01 DIAGNOSIS — C7989 Secondary malignant neoplasm of other specified sites: Secondary | ICD-10-CM | POA: Diagnosis not present

## 2019-05-01 DIAGNOSIS — N2889 Other specified disorders of kidney and ureter: Secondary | ICD-10-CM | POA: Diagnosis not present

## 2019-05-01 DIAGNOSIS — C801 Malignant (primary) neoplasm, unspecified: Secondary | ICD-10-CM | POA: Diagnosis not present

## 2019-05-01 DIAGNOSIS — J189 Pneumonia, unspecified organism: Secondary | ICD-10-CM | POA: Diagnosis not present

## 2019-05-01 DIAGNOSIS — E278 Other specified disorders of adrenal gland: Secondary | ICD-10-CM | POA: Diagnosis not present

## 2019-05-01 DIAGNOSIS — J9621 Acute and chronic respiratory failure with hypoxia: Secondary | ICD-10-CM | POA: Diagnosis not present

## 2019-05-04 DIAGNOSIS — E785 Hyperlipidemia, unspecified: Secondary | ICD-10-CM | POA: Diagnosis not present

## 2019-05-04 DIAGNOSIS — Z87891 Personal history of nicotine dependence: Secondary | ICD-10-CM | POA: Diagnosis not present

## 2019-05-04 DIAGNOSIS — Z87820 Personal history of traumatic brain injury: Secondary | ICD-10-CM | POA: Diagnosis not present

## 2019-05-04 DIAGNOSIS — I5030 Unspecified diastolic (congestive) heart failure: Secondary | ICD-10-CM | POA: Diagnosis not present

## 2019-05-04 DIAGNOSIS — E278 Other specified disorders of adrenal gland: Secondary | ICD-10-CM | POA: Diagnosis not present

## 2019-05-04 DIAGNOSIS — M4800 Spinal stenosis, site unspecified: Secondary | ICD-10-CM | POA: Diagnosis not present

## 2019-05-04 DIAGNOSIS — J189 Pneumonia, unspecified organism: Secondary | ICD-10-CM | POA: Diagnosis not present

## 2019-05-04 DIAGNOSIS — Z794 Long term (current) use of insulin: Secondary | ICD-10-CM | POA: Diagnosis not present

## 2019-05-04 DIAGNOSIS — J449 Chronic obstructive pulmonary disease, unspecified: Secondary | ICD-10-CM | POA: Diagnosis not present

## 2019-05-04 DIAGNOSIS — I110 Hypertensive heart disease with heart failure: Secondary | ICD-10-CM | POA: Diagnosis not present

## 2019-05-04 DIAGNOSIS — C7989 Secondary malignant neoplasm of other specified sites: Secondary | ICD-10-CM | POA: Diagnosis not present

## 2019-05-04 DIAGNOSIS — Z9981 Dependence on supplemental oxygen: Secondary | ICD-10-CM | POA: Diagnosis not present

## 2019-05-04 DIAGNOSIS — J9621 Acute and chronic respiratory failure with hypoxia: Secondary | ICD-10-CM | POA: Diagnosis not present

## 2019-05-04 DIAGNOSIS — E669 Obesity, unspecified: Secondary | ICD-10-CM | POA: Diagnosis not present

## 2019-05-04 DIAGNOSIS — E119 Type 2 diabetes mellitus without complications: Secondary | ICD-10-CM | POA: Diagnosis not present

## 2019-05-04 DIAGNOSIS — N2889 Other specified disorders of kidney and ureter: Secondary | ICD-10-CM | POA: Diagnosis not present

## 2019-05-04 DIAGNOSIS — C801 Malignant (primary) neoplasm, unspecified: Secondary | ICD-10-CM | POA: Diagnosis not present

## 2019-05-05 DIAGNOSIS — C801 Malignant (primary) neoplasm, unspecified: Secondary | ICD-10-CM | POA: Diagnosis not present

## 2019-05-05 DIAGNOSIS — N2889 Other specified disorders of kidney and ureter: Secondary | ICD-10-CM | POA: Diagnosis not present

## 2019-05-05 DIAGNOSIS — E278 Other specified disorders of adrenal gland: Secondary | ICD-10-CM | POA: Diagnosis not present

## 2019-05-05 DIAGNOSIS — J189 Pneumonia, unspecified organism: Secondary | ICD-10-CM | POA: Diagnosis not present

## 2019-05-05 DIAGNOSIS — C7989 Secondary malignant neoplasm of other specified sites: Secondary | ICD-10-CM | POA: Diagnosis not present

## 2019-05-05 DIAGNOSIS — J9621 Acute and chronic respiratory failure with hypoxia: Secondary | ICD-10-CM | POA: Diagnosis not present

## 2019-05-08 DIAGNOSIS — J9621 Acute and chronic respiratory failure with hypoxia: Secondary | ICD-10-CM | POA: Diagnosis not present

## 2019-05-08 DIAGNOSIS — N2889 Other specified disorders of kidney and ureter: Secondary | ICD-10-CM | POA: Diagnosis not present

## 2019-05-08 DIAGNOSIS — C801 Malignant (primary) neoplasm, unspecified: Secondary | ICD-10-CM | POA: Diagnosis not present

## 2019-05-08 DIAGNOSIS — J189 Pneumonia, unspecified organism: Secondary | ICD-10-CM | POA: Diagnosis not present

## 2019-05-08 DIAGNOSIS — C7989 Secondary malignant neoplasm of other specified sites: Secondary | ICD-10-CM | POA: Diagnosis not present

## 2019-05-08 DIAGNOSIS — E278 Other specified disorders of adrenal gland: Secondary | ICD-10-CM | POA: Diagnosis not present

## 2019-05-11 DIAGNOSIS — C7989 Secondary malignant neoplasm of other specified sites: Secondary | ICD-10-CM | POA: Diagnosis not present

## 2019-05-11 DIAGNOSIS — C801 Malignant (primary) neoplasm, unspecified: Secondary | ICD-10-CM | POA: Diagnosis not present

## 2019-05-11 DIAGNOSIS — J9621 Acute and chronic respiratory failure with hypoxia: Secondary | ICD-10-CM | POA: Diagnosis not present

## 2019-05-11 DIAGNOSIS — N2889 Other specified disorders of kidney and ureter: Secondary | ICD-10-CM | POA: Diagnosis not present

## 2019-05-11 DIAGNOSIS — J189 Pneumonia, unspecified organism: Secondary | ICD-10-CM | POA: Diagnosis not present

## 2019-05-11 DIAGNOSIS — E278 Other specified disorders of adrenal gland: Secondary | ICD-10-CM | POA: Diagnosis not present

## 2019-05-12 ENCOUNTER — Telehealth: Payer: Self-pay | Admitting: Hospice and Palliative Medicine

## 2019-05-12 DIAGNOSIS — J189 Pneumonia, unspecified organism: Secondary | ICD-10-CM | POA: Diagnosis not present

## 2019-05-12 DIAGNOSIS — N2889 Other specified disorders of kidney and ureter: Secondary | ICD-10-CM | POA: Diagnosis not present

## 2019-05-12 DIAGNOSIS — C7989 Secondary malignant neoplasm of other specified sites: Secondary | ICD-10-CM | POA: Diagnosis not present

## 2019-05-12 DIAGNOSIS — J9621 Acute and chronic respiratory failure with hypoxia: Secondary | ICD-10-CM | POA: Diagnosis not present

## 2019-05-12 DIAGNOSIS — C801 Malignant (primary) neoplasm, unspecified: Secondary | ICD-10-CM | POA: Diagnosis not present

## 2019-05-12 DIAGNOSIS — E278 Other specified disorders of adrenal gland: Secondary | ICD-10-CM | POA: Diagnosis not present

## 2019-05-12 NOTE — Telephone Encounter (Signed)
I received a call from hospice nurse, Jan, asking me to call patient's daughter, Scott Simpson. I called daughter - [phone].  She says the patient has been doing well since returning home with hospice.  He has been relatively stable without any significant changes or decline.  She wanted to know our best estimation for his life expectancy.  I explained that prognostication is a difficult and inexact science.  Patient most likely has months but I would not be surprised to learn of a sudden decline sooner than that.  She requested repeat CT scans in a couple months to reassess cancer progression.  Discussed with Dr. Rogue Bussing.  Daughter is comfortable waiting a couple of months to see how patient does and then we can reevaluate the need for imaging.  We discussed CODE STATUS.  She says that she and her family have had a lengthy conversation about patient's wishes.  They want him to remain a full code for now.

## 2019-05-13 DIAGNOSIS — N2889 Other specified disorders of kidney and ureter: Secondary | ICD-10-CM | POA: Diagnosis not present

## 2019-05-13 DIAGNOSIS — C7989 Secondary malignant neoplasm of other specified sites: Secondary | ICD-10-CM | POA: Diagnosis not present

## 2019-05-13 DIAGNOSIS — J9621 Acute and chronic respiratory failure with hypoxia: Secondary | ICD-10-CM | POA: Diagnosis not present

## 2019-05-13 DIAGNOSIS — C801 Malignant (primary) neoplasm, unspecified: Secondary | ICD-10-CM | POA: Diagnosis not present

## 2019-05-13 DIAGNOSIS — J189 Pneumonia, unspecified organism: Secondary | ICD-10-CM | POA: Diagnosis not present

## 2019-05-13 DIAGNOSIS — E278 Other specified disorders of adrenal gland: Secondary | ICD-10-CM | POA: Diagnosis not present

## 2019-05-18 DIAGNOSIS — C7989 Secondary malignant neoplasm of other specified sites: Secondary | ICD-10-CM | POA: Diagnosis not present

## 2019-05-18 DIAGNOSIS — E278 Other specified disorders of adrenal gland: Secondary | ICD-10-CM | POA: Diagnosis not present

## 2019-05-18 DIAGNOSIS — J9621 Acute and chronic respiratory failure with hypoxia: Secondary | ICD-10-CM | POA: Diagnosis not present

## 2019-05-18 DIAGNOSIS — J189 Pneumonia, unspecified organism: Secondary | ICD-10-CM | POA: Diagnosis not present

## 2019-05-18 DIAGNOSIS — C801 Malignant (primary) neoplasm, unspecified: Secondary | ICD-10-CM | POA: Diagnosis not present

## 2019-05-18 DIAGNOSIS — N2889 Other specified disorders of kidney and ureter: Secondary | ICD-10-CM | POA: Diagnosis not present

## 2019-05-19 DIAGNOSIS — J9621 Acute and chronic respiratory failure with hypoxia: Secondary | ICD-10-CM | POA: Diagnosis not present

## 2019-05-19 DIAGNOSIS — E278 Other specified disorders of adrenal gland: Secondary | ICD-10-CM | POA: Diagnosis not present

## 2019-05-19 DIAGNOSIS — N2889 Other specified disorders of kidney and ureter: Secondary | ICD-10-CM | POA: Diagnosis not present

## 2019-05-19 DIAGNOSIS — J189 Pneumonia, unspecified organism: Secondary | ICD-10-CM | POA: Diagnosis not present

## 2019-05-19 DIAGNOSIS — C7989 Secondary malignant neoplasm of other specified sites: Secondary | ICD-10-CM | POA: Diagnosis not present

## 2019-05-19 DIAGNOSIS — C801 Malignant (primary) neoplasm, unspecified: Secondary | ICD-10-CM | POA: Diagnosis not present

## 2019-05-20 DIAGNOSIS — E278 Other specified disorders of adrenal gland: Secondary | ICD-10-CM | POA: Diagnosis not present

## 2019-05-20 DIAGNOSIS — C801 Malignant (primary) neoplasm, unspecified: Secondary | ICD-10-CM | POA: Diagnosis not present

## 2019-05-20 DIAGNOSIS — J9621 Acute and chronic respiratory failure with hypoxia: Secondary | ICD-10-CM | POA: Diagnosis not present

## 2019-05-20 DIAGNOSIS — N2889 Other specified disorders of kidney and ureter: Secondary | ICD-10-CM | POA: Diagnosis not present

## 2019-05-20 DIAGNOSIS — C7989 Secondary malignant neoplasm of other specified sites: Secondary | ICD-10-CM | POA: Diagnosis not present

## 2019-05-20 DIAGNOSIS — J189 Pneumonia, unspecified organism: Secondary | ICD-10-CM | POA: Diagnosis not present

## 2019-05-22 DIAGNOSIS — E278 Other specified disorders of adrenal gland: Secondary | ICD-10-CM | POA: Diagnosis not present

## 2019-05-22 DIAGNOSIS — C801 Malignant (primary) neoplasm, unspecified: Secondary | ICD-10-CM | POA: Diagnosis not present

## 2019-05-22 DIAGNOSIS — J9621 Acute and chronic respiratory failure with hypoxia: Secondary | ICD-10-CM | POA: Diagnosis not present

## 2019-05-22 DIAGNOSIS — J189 Pneumonia, unspecified organism: Secondary | ICD-10-CM | POA: Diagnosis not present

## 2019-05-22 DIAGNOSIS — N2889 Other specified disorders of kidney and ureter: Secondary | ICD-10-CM | POA: Diagnosis not present

## 2019-05-22 DIAGNOSIS — C7989 Secondary malignant neoplasm of other specified sites: Secondary | ICD-10-CM | POA: Diagnosis not present

## 2019-05-25 DIAGNOSIS — J9621 Acute and chronic respiratory failure with hypoxia: Secondary | ICD-10-CM | POA: Diagnosis not present

## 2019-05-25 DIAGNOSIS — C801 Malignant (primary) neoplasm, unspecified: Secondary | ICD-10-CM | POA: Diagnosis not present

## 2019-05-25 DIAGNOSIS — E278 Other specified disorders of adrenal gland: Secondary | ICD-10-CM | POA: Diagnosis not present

## 2019-05-25 DIAGNOSIS — N2889 Other specified disorders of kidney and ureter: Secondary | ICD-10-CM | POA: Diagnosis not present

## 2019-05-25 DIAGNOSIS — J189 Pneumonia, unspecified organism: Secondary | ICD-10-CM | POA: Diagnosis not present

## 2019-05-25 DIAGNOSIS — C7989 Secondary malignant neoplasm of other specified sites: Secondary | ICD-10-CM | POA: Diagnosis not present

## 2019-05-26 DIAGNOSIS — E278 Other specified disorders of adrenal gland: Secondary | ICD-10-CM | POA: Diagnosis not present

## 2019-05-26 DIAGNOSIS — J9621 Acute and chronic respiratory failure with hypoxia: Secondary | ICD-10-CM | POA: Diagnosis not present

## 2019-05-26 DIAGNOSIS — N2889 Other specified disorders of kidney and ureter: Secondary | ICD-10-CM | POA: Diagnosis not present

## 2019-05-26 DIAGNOSIS — C801 Malignant (primary) neoplasm, unspecified: Secondary | ICD-10-CM | POA: Diagnosis not present

## 2019-05-26 DIAGNOSIS — J189 Pneumonia, unspecified organism: Secondary | ICD-10-CM | POA: Diagnosis not present

## 2019-05-26 DIAGNOSIS — C7989 Secondary malignant neoplasm of other specified sites: Secondary | ICD-10-CM | POA: Diagnosis not present

## 2019-05-29 DIAGNOSIS — N2889 Other specified disorders of kidney and ureter: Secondary | ICD-10-CM | POA: Diagnosis not present

## 2019-05-29 DIAGNOSIS — C7989 Secondary malignant neoplasm of other specified sites: Secondary | ICD-10-CM | POA: Diagnosis not present

## 2019-05-29 DIAGNOSIS — J189 Pneumonia, unspecified organism: Secondary | ICD-10-CM | POA: Diagnosis not present

## 2019-05-29 DIAGNOSIS — E278 Other specified disorders of adrenal gland: Secondary | ICD-10-CM | POA: Diagnosis not present

## 2019-05-29 DIAGNOSIS — J9621 Acute and chronic respiratory failure with hypoxia: Secondary | ICD-10-CM | POA: Diagnosis not present

## 2019-05-29 DIAGNOSIS — C801 Malignant (primary) neoplasm, unspecified: Secondary | ICD-10-CM | POA: Diagnosis not present

## 2019-06-01 ENCOUNTER — Other Ambulatory Visit: Payer: Self-pay | Admitting: Hospice and Palliative Medicine

## 2019-06-01 DIAGNOSIS — C801 Malignant (primary) neoplasm, unspecified: Secondary | ICD-10-CM | POA: Diagnosis not present

## 2019-06-01 DIAGNOSIS — J9621 Acute and chronic respiratory failure with hypoxia: Secondary | ICD-10-CM | POA: Diagnosis not present

## 2019-06-01 DIAGNOSIS — J189 Pneumonia, unspecified organism: Secondary | ICD-10-CM | POA: Diagnosis not present

## 2019-06-01 DIAGNOSIS — C7989 Secondary malignant neoplasm of other specified sites: Secondary | ICD-10-CM | POA: Diagnosis not present

## 2019-06-01 DIAGNOSIS — N2889 Other specified disorders of kidney and ureter: Secondary | ICD-10-CM | POA: Diagnosis not present

## 2019-06-01 DIAGNOSIS — E278 Other specified disorders of adrenal gland: Secondary | ICD-10-CM | POA: Diagnosis not present

## 2019-06-01 MED ORDER — LORAZEPAM 0.5 MG PO TABS: 0.5000 mg | ORAL_TABLET | ORAL | 0 refills | Status: AC | PRN

## 2019-06-01 MED ORDER — MORPHINE SULFATE (CONCENTRATE) 10 MG /0.5 ML PO SOLN: 5.0000 mg | ORAL | 0 refills | Status: AC | PRN

## 2019-06-01 NOTE — Progress Notes (Signed)
I spoke with patient's hospice nurse, Jan.  Patient reportedly is acutely declining with shortness of breath and hypoxia.  They have been using lorazepam/morphine and refills are requested for both.  Also discussed liberalizing dosing if needed for comfort.  Note that patient is still a full code at last conversation with family.  I called daughter Levada Dy to clarify.  She says that patient has decided not to go to the hospital or clinic.  Family primarily want to keep him home and comfortable.  She agreed to allow me to sign a DNR order which hospice will take to the home.  Case and plan discussed with Dr. Rogue Bussing

## 2019-06-02 ENCOUNTER — Telehealth: Payer: Self-pay | Admitting: *Deleted

## 2019-06-02 DIAGNOSIS — N2889 Other specified disorders of kidney and ureter: Secondary | ICD-10-CM | POA: Diagnosis not present

## 2019-06-02 DIAGNOSIS — C7989 Secondary malignant neoplasm of other specified sites: Secondary | ICD-10-CM | POA: Diagnosis not present

## 2019-06-02 DIAGNOSIS — E278 Other specified disorders of adrenal gland: Secondary | ICD-10-CM | POA: Diagnosis not present

## 2019-06-02 DIAGNOSIS — J189 Pneumonia, unspecified organism: Secondary | ICD-10-CM | POA: Diagnosis not present

## 2019-06-02 DIAGNOSIS — C801 Malignant (primary) neoplasm, unspecified: Secondary | ICD-10-CM | POA: Diagnosis not present

## 2019-06-02 DIAGNOSIS — J9621 Acute and chronic respiratory failure with hypoxia: Secondary | ICD-10-CM | POA: Diagnosis not present

## 2019-06-04 NOTE — Telephone Encounter (Signed)
Jan [facility] Hosp[facility] called reporting that patient expired this morning

## 2019-06-04 DEATH — deceased

## 2021-07-08 IMAGING — CT CT ABD-PEL WO/W CM
2 of 12 series · 12 of 46 positions shown, 17 images · IV contrast (omnipaque)
Comparison: Partial comparison to CTA chest dated 04/12/2019.

CLINICAL DATA: Right renal mass on CT chest

EXAM:
CT ABDOMEN AND PELVIS WITHOUT AND WITH CONTRAST
TECHNIQUE: Multidetector CT imaging of the abdomen and pelvis was performed
following the standard protocol before and following the bolus
administration of intravenous contrast.
CONTRAST:  125mL OMNIPAQUE IOHEXOL 300 MG/ML  SOLN

[Series 4: coronal pre · coronal · non-contrast · 0.61mm/px · 2 of 141 slices shown]
[im 47/141  soft-tissue]
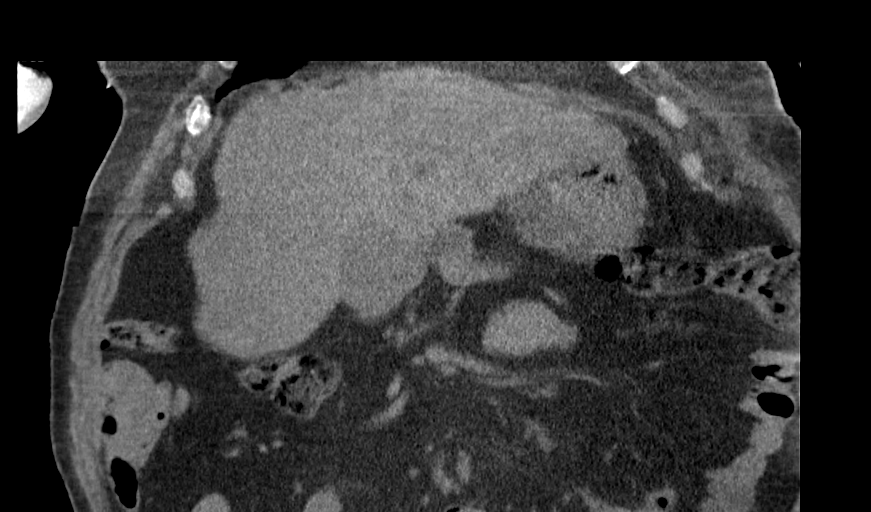
[im 94/141  soft-tissue]
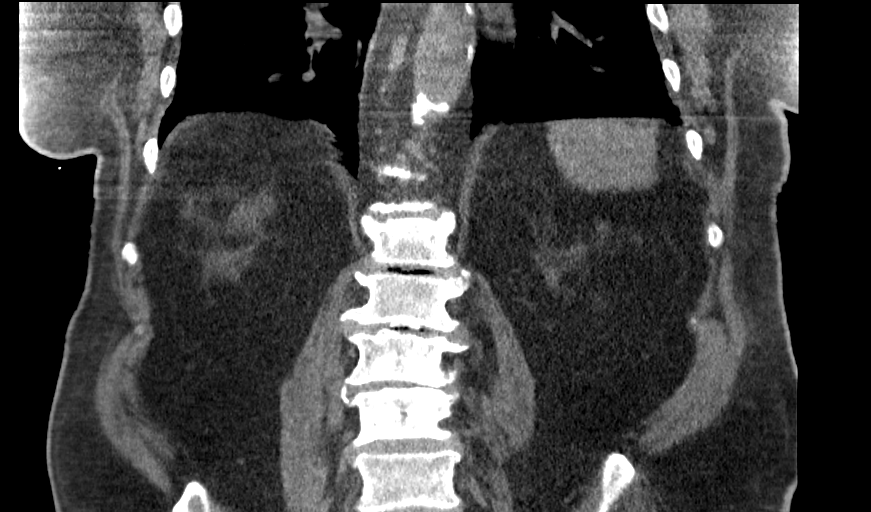

[Series 12: renal nephrographic · axial · 0.98mm/px · z∈[-956,-503]mm · 10 of 183 slices shown, 15 images]
[im 16/183  soft-tissue]
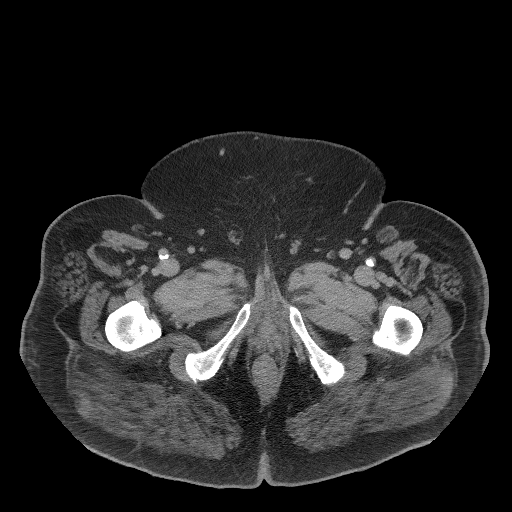
[im 16/183  bone]
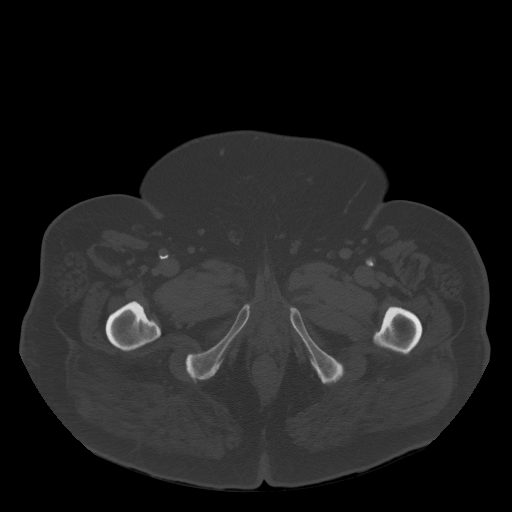
[im 31/183  soft-tissue]
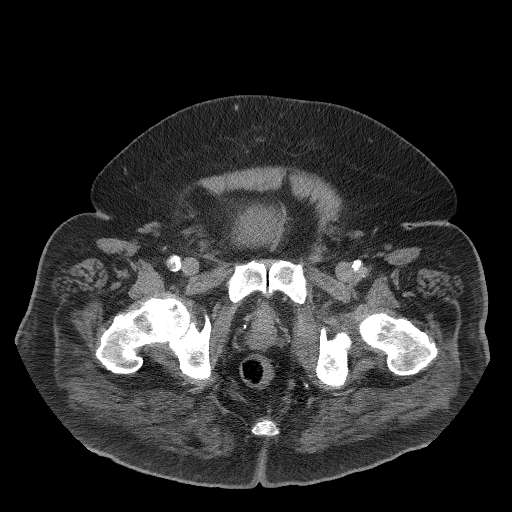
[im 61/183  soft-tissue]
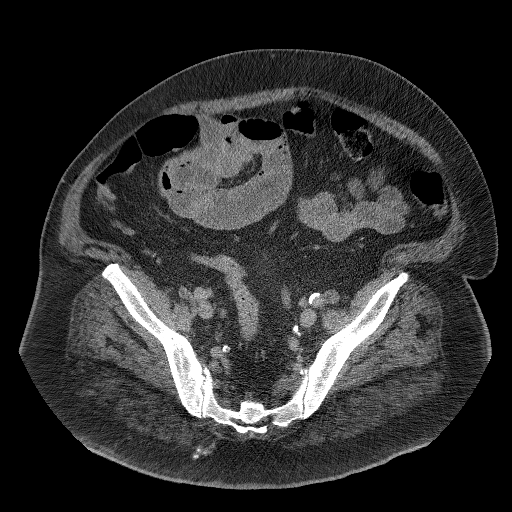
[im 76/183  soft-tissue]
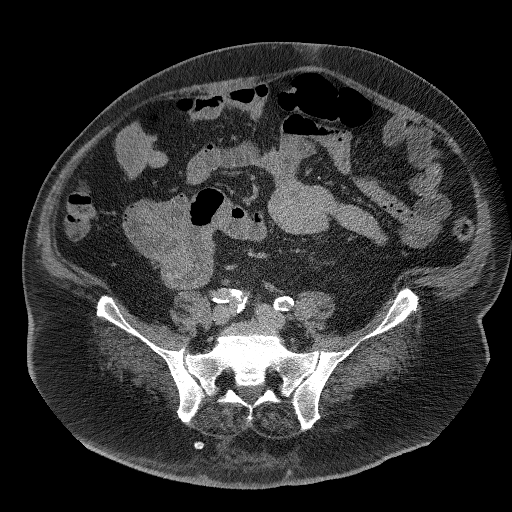
[im 92/183  soft-tissue]
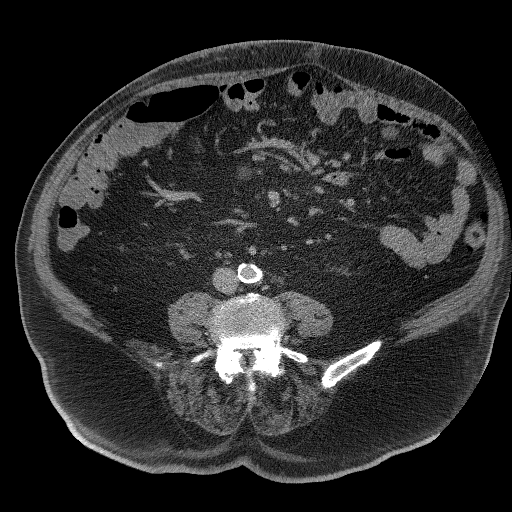
[im 107/183  soft-tissue]
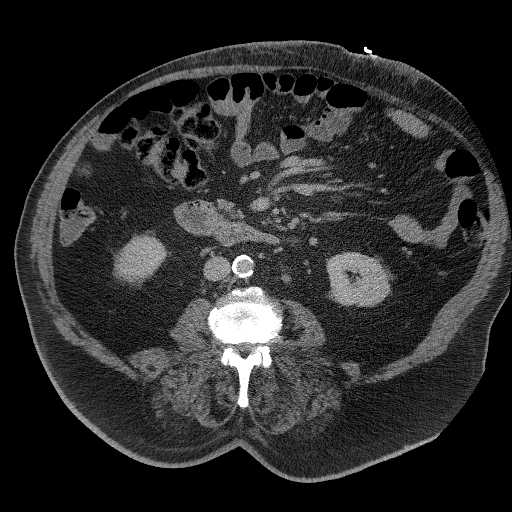
[im 122/183  soft-tissue]
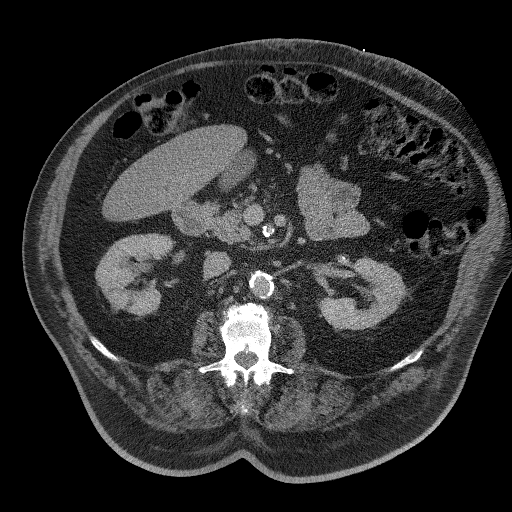
[im 122/183  lung]
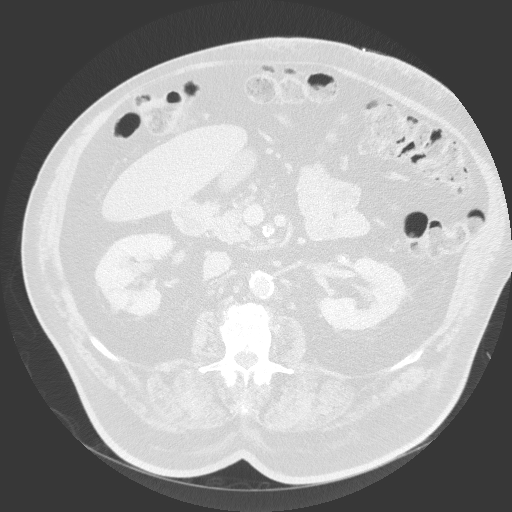
[im 137/183  lung]
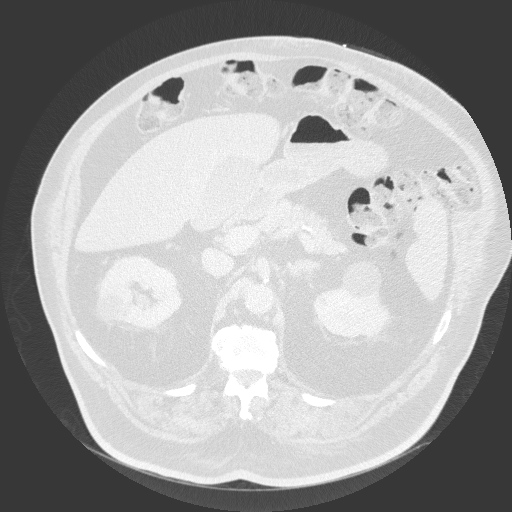
[im 152/183  soft-tissue]
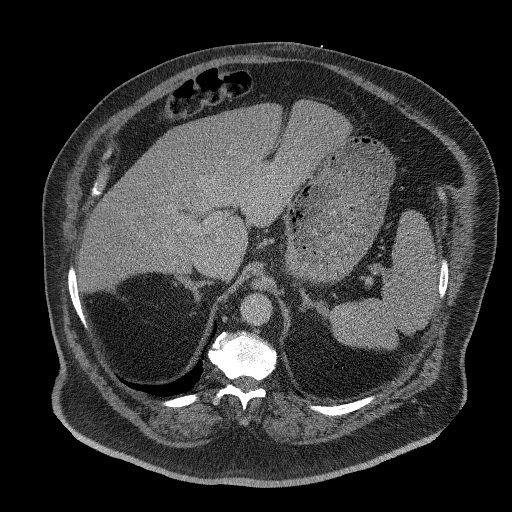
[im 152/183  lung]
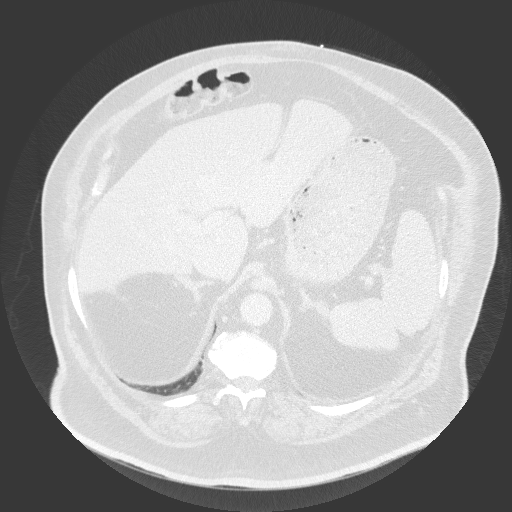
[im 167/183  soft-tissue]
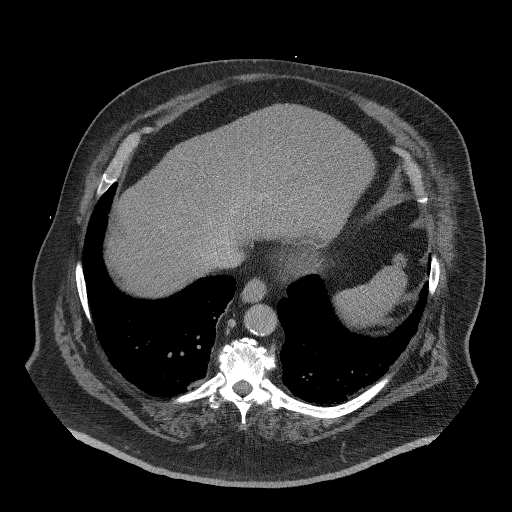
[im 167/183  lung]
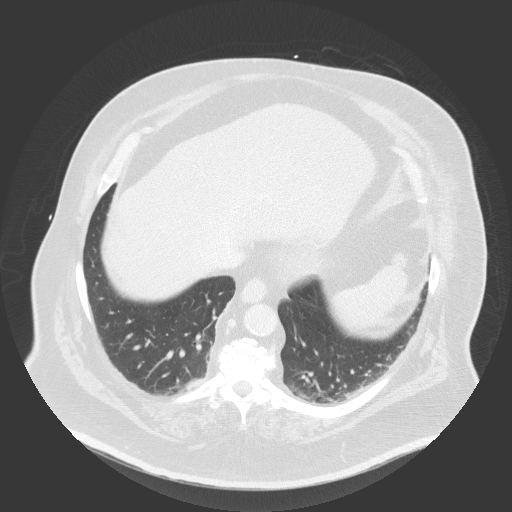
[im 167/183  bone]
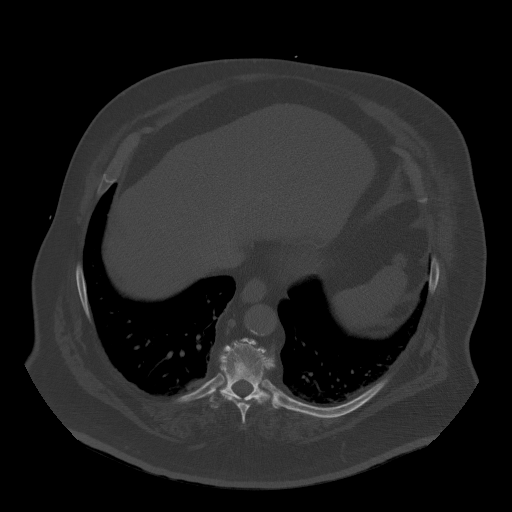

[12 of 46 positions shown; findings below may reference images not displayed]

FINDINGS: Motion degraded images.

Lower chest: Minimal dependent atelectasis at the lung bases.

Hepatobiliary: Mild hepatic steatosis with focal fatty sparing along
the gallbladder fossa.

Gallbladder is unremarkable. No intrahepatic or extrahepatic ductal
dilatation.

Pancreas: Within normal limits.

Spleen: Within normal limits.

Adrenals/Urinary Tract: Adrenal glands are within normal limits.

3.3 x 4.0 x 3.8 cm enhancing lateral right upper pole renal mass
(series 7/image 40), corresponding to the CT abnormality, compatible
with solid renal neoplasm such as renal cell carcinoma.

Multiple bilateral renal cysts, measuring up to 3.5 cm in the
anterior left upper kidney (series 7/image 47), benign (Bosniak I).
No renal calculi or hydronephrosis.

Bladder is within normal limits.

Stomach/Bowel: Stomach is notable for a tiny hiatal hernia.

No evidence of bowel obstruction.

Normal appendix (series 12/image 120).

Sigmoid diverticulosis, without evidence of diverticulitis.

Vascular/Lymphatic: No evidence of abdominal aortic aneurysm.

Atherosclerotic calcifications of the abdominal aorta and branch
vessels.

Single right renal artery. Accessory right lower pole renal vein
(series 7/image 60). No renal vein invasion.

No suspicious abdominopelvic lymphadenopathy. Small retroperitoneal
lymph nodes, including a 7 mm short axis right retrocaval node
(series 7/image 85), within normal limits.

Reproductive: Prostate is unremarkable.

Other: No abdominopelvic ascites.

Musculoskeletal: Degenerative changes of the visualized
thoracolumbar spine.
IMPRESSION: 4.0 cm enhancing mass in the lateral right upper pole, corresponding
to the CT abnormality, compatible with solid renal neoplasm such as
renal cell carcinoma.

Single right renal artery. Accessory right lower pole renal vein. No
renal vein invasion.

No regional lymphadenopathy or metastatic disease.
